# Patient Record
Sex: Female | Born: 1980 | Race: White | Hispanic: No | Marital: Single | Smoking: Current some day smoker
Health system: Southern US, Community
[De-identification: ages and names within clinical notes are randomized; demographics above are authoritative.]

## PROBLEM LIST (undated history)

## (undated) DIAGNOSIS — C4490 Unspecified malignant neoplasm of skin, unspecified: Secondary | ICD-10-CM

## (undated) DIAGNOSIS — F32A Depression, unspecified: Secondary | ICD-10-CM

## (undated) DIAGNOSIS — F329 Major depressive disorder, single episode, unspecified: Secondary | ICD-10-CM

## (undated) HISTORY — PX: OTHER SURGICAL HISTORY: SHX169

---

## 2005-03-20 ENCOUNTER — Inpatient Hospital Stay: Payer: Self-pay | Admitting: Unknown Physician Specialty

## 2012-03-16 ENCOUNTER — Emergency Department: Payer: Self-pay | Admitting: Emergency Medicine

## 2012-03-16 LAB — URINALYSIS, COMPLETE
Bilirubin,UR: NEGATIVE
Glucose,UR: NEGATIVE mg/dL (ref 0–75)
Ketone: NEGATIVE
Nitrite: NEGATIVE
Protein: 30
Specific Gravity: 1.021 (ref 1.003–1.030)
Squamous Epithelial: 73

## 2012-03-16 LAB — ETHANOL
Ethanol %: <0.003 % (ref 0.000–0.080)
Ethanol: <3 mg/dL

## 2012-03-16 LAB — CBC
HCT: 42.3 % (ref 35.0–47.0)
MCHC: 34.9 g/dL (ref 32.0–36.0)
MCV: 88 fL (ref 80–100)
Platelet: 234 10*3/uL (ref 150–440)
RBC: 4.81 10*6/uL (ref 3.80–5.20)
RDW: 12.8 % (ref 11.5–14.5)

## 2012-03-16 LAB — DRUG SCREEN, URINE
Amphetamines, Ur Screen: NEGATIVE (ref ?–1000)
Barbiturates, Ur Screen: NEGATIVE (ref ?–200)
Cannabinoid 50 Ng, Ur ~~LOC~~: NEGATIVE (ref ?–50)
Cocaine Metabolite,Ur ~~LOC~~: NEGATIVE (ref ?–300)
MDMA (Ecstasy)Ur Screen: NEGATIVE (ref ?–500)
Phencyclidine (PCP) Ur S: NEGATIVE (ref ?–25)
Tricyclic, Ur Screen: NEGATIVE (ref ?–1000)

## 2012-03-16 LAB — COMPREHENSIVE METABOLIC PANEL
Albumin: 4.5 g/dL (ref 3.4–5.0)
Anion Gap: 5 — ABNORMAL LOW (ref 7–16)
BUN: 7 mg/dL (ref 7–18)
Bilirubin,Total: 0.5 mg/dL (ref 0.2–1.0)
Chloride: 107 mmol/L (ref 98–107)
Co2: 26 mmol/L (ref 21–32)
Osmolality: 274 (ref 275–301)
SGOT(AST): 17 U/L (ref 15–37)
SGPT (ALT): 15 U/L (ref 12–78)
Sodium: 138 mmol/L (ref 136–145)

## 2012-03-16 LAB — TSH: Thyroid Stimulating Horm: 0.65 u[IU]/mL

## 2012-10-14 ENCOUNTER — Emergency Department: Payer: Self-pay | Admitting: Emergency Medicine

## 2012-10-14 LAB — COMPREHENSIVE METABOLIC PANEL
Albumin: 4 g/dL (ref 3.4–5.0)
Alkaline Phosphatase: 83 U/L (ref 50–136)
Anion Gap: 3 — ABNORMAL LOW (ref 7–16)
Bilirubin,Total: 0.3 mg/dL (ref 0.2–1.0)
Calcium, Total: 8.5 mg/dL (ref 8.5–10.1)
Chloride: 105 mmol/L (ref 98–107)
Creatinine: 0.69 mg/dL (ref 0.60–1.30)
EGFR (African American): >60
EGFR (Non-African Amer.): >60
Osmolality: 276 (ref 275–301)
SGOT(AST): 19 U/L (ref 15–37)
Sodium: 138 mmol/L (ref 136–145)
Total Protein: 7.9 g/dL (ref 6.4–8.2)

## 2012-10-14 LAB — CBC
HCT: 40.1 % (ref 35.0–47.0)
HGB: 13.4 g/dL (ref 12.0–16.0)
MCH: 29.8 pg (ref 26.0–34.0)
MCHC: 33.5 g/dL (ref 32.0–36.0)
Platelet: 209 10*3/uL (ref 150–440)
RBC: 4.51 10*6/uL (ref 3.80–5.20)
WBC: 10.6 10*3/uL (ref 3.6–11.0)

## 2012-10-14 LAB — ETHANOL: Ethanol %: <0.003 % (ref 0.000–0.080)

## 2012-10-14 LAB — TSH: Thyroid Stimulating Horm: 0.49 u[IU]/mL

## 2012-10-23 ENCOUNTER — Emergency Department: Payer: Self-pay | Admitting: Emergency Medicine

## 2012-10-23 LAB — COMPREHENSIVE METABOLIC PANEL
Albumin: 4.4 g/dL (ref 3.4–5.0)
Alkaline Phosphatase: 63 U/L (ref 50–136)
Anion Gap: 7 (ref 7–16)
BUN: 9 mg/dL (ref 7–18)
Bilirubin,Total: 0.4 mg/dL (ref 0.2–1.0)
Calcium, Total: 9 mg/dL (ref 8.5–10.1)
Chloride: 104 mmol/L (ref 98–107)
Co2: 24 mmol/L (ref 21–32)
EGFR (Non-African Amer.): >60
Potassium: 4 mmol/L (ref 3.5–5.1)
SGOT(AST): 17 U/L (ref 15–37)
SGPT (ALT): 17 U/L (ref 12–78)
Sodium: 135 mmol/L — ABNORMAL LOW (ref 136–145)

## 2012-10-23 LAB — CBC
HGB: 14.7 g/dL (ref 12.0–16.0)
MCH: 30.8 pg (ref 26.0–34.0)
MCHC: 35.1 g/dL (ref 32.0–36.0)
MCV: 88 fL (ref 80–100)
RBC: 4.78 10*6/uL (ref 3.80–5.20)
RDW: 12.4 % (ref 11.5–14.5)
WBC: 12.8 10*3/uL — ABNORMAL HIGH (ref 3.6–11.0)

## 2012-10-23 LAB — DRUG SCREEN, URINE
Amphetamines, Ur Screen: NEGATIVE (ref ?–1000)
Barbiturates, Ur Screen: NEGATIVE (ref ?–200)
MDMA (Ecstasy)Ur Screen: NEGATIVE (ref ?–500)
Opiate, Ur Screen: NEGATIVE (ref ?–300)
Phencyclidine (PCP) Ur S: NEGATIVE (ref ?–25)

## 2012-10-23 LAB — TSH: Thyroid Stimulating Horm: 0.55 u[IU]/mL

## 2012-10-23 LAB — ACETAMINOPHEN LEVEL: Acetaminophen: <2 ug/mL

## 2012-10-23 LAB — SALICYLATE LEVEL: Salicylates, Serum: 2.7 mg/dL

## 2012-11-12 ENCOUNTER — Emergency Department: Payer: Self-pay | Admitting: Emergency Medicine

## 2012-11-12 LAB — URINALYSIS, COMPLETE
Bilirubin,UR: NEGATIVE
Blood: NEGATIVE
Glucose,UR: NEGATIVE mg/dL (ref 0–75)
Nitrite: NEGATIVE
Ph: 7 (ref 4.5–8.0)
Specific Gravity: 1.02 (ref 1.003–1.030)
WBC UR: 9 /HPF (ref 0–5)

## 2012-11-12 LAB — COMPREHENSIVE METABOLIC PANEL
BUN: 8 mg/dL (ref 7–18)
Co2: 27 mmol/L (ref 21–32)
Creatinine: 0.62 mg/dL (ref 0.60–1.30)
EGFR (African American): >60
Glucose: 98 mg/dL (ref 65–99)
Osmolality: 274 (ref 275–301)
SGOT(AST): 16 U/L (ref 15–37)
SGPT (ALT): 14 U/L (ref 12–78)
Sodium: 138 mmol/L (ref 136–145)

## 2012-11-12 LAB — TSH: Thyroid Stimulating Horm: 0.94 u[IU]/mL

## 2012-11-12 LAB — DRUG SCREEN, URINE
Barbiturates, Ur Screen: NEGATIVE (ref ?–200)
Benzodiazepine, Ur Scrn: NEGATIVE (ref ?–200)
MDMA (Ecstasy)Ur Screen: NEGATIVE (ref ?–500)
Phencyclidine (PCP) Ur S: NEGATIVE (ref ?–25)
Tricyclic, Ur Screen: NEGATIVE (ref ?–1000)

## 2012-11-12 LAB — ETHANOL
Ethanol %: <0.003 % (ref 0.000–0.080)
Ethanol: <3 mg/dL

## 2012-11-12 LAB — CBC
HCT: 38.2 % (ref 35.0–47.0)
Platelet: 233 10*3/uL (ref 150–440)
RBC: 4.35 10*6/uL (ref 3.80–5.20)

## 2012-11-12 LAB — PREGNANCY, URINE: Pregnancy Test, Urine: NEGATIVE m[IU]/mL

## 2012-12-13 ENCOUNTER — Emergency Department: Payer: Self-pay | Admitting: Emergency Medicine

## 2012-12-13 LAB — TSH: Thyroid Stimulating Horm: 2.2 u[IU]/mL

## 2012-12-13 LAB — COMPREHENSIVE METABOLIC PANEL
Alkaline Phosphatase: 67 U/L (ref 50–136)
Anion Gap: 8 (ref 7–16)
Chloride: 105 mmol/L (ref 98–107)
Co2: 23 mmol/L (ref 21–32)
Creatinine: 0.79 mg/dL (ref 0.60–1.30)
EGFR (African American): >60
EGFR (Non-African Amer.): >60
Glucose: 112 mg/dL — ABNORMAL HIGH (ref 65–99)
Osmolality: 274 (ref 275–301)
Potassium: 4.1 mmol/L (ref 3.5–5.1)
SGOT(AST): 16 U/L (ref 15–37)
Sodium: 136 mmol/L (ref 136–145)

## 2012-12-13 LAB — URINALYSIS, COMPLETE
Bilirubin,UR: NEGATIVE
Blood: NEGATIVE
Glucose,UR: NEGATIVE mg/dL (ref 0–75)
Nitrite: NEGATIVE
Ph: 6 (ref 4.5–8.0)
RBC,UR: 2 /HPF (ref 0–5)
Squamous Epithelial: 2
WBC UR: 1 /HPF (ref 0–5)

## 2012-12-13 LAB — CBC
HCT: 39.7 % (ref 35.0–47.0)
MCH: 30.2 pg (ref 26.0–34.0)
Platelet: 254 10*3/uL (ref 150–440)
RDW: 12.6 % (ref 11.5–14.5)
WBC: 10 10*3/uL (ref 3.6–11.0)

## 2012-12-13 LAB — DRUG SCREEN, URINE
Amphetamines, Ur Screen: NEGATIVE (ref ?–1000)
Benzodiazepine, Ur Scrn: NEGATIVE (ref ?–200)
Cannabinoid 50 Ng, Ur ~~LOC~~: NEGATIVE (ref ?–50)
Methadone, Ur Screen: NEGATIVE (ref ?–300)
Opiate, Ur Screen: NEGATIVE (ref ?–300)
Phencyclidine (PCP) Ur S: NEGATIVE (ref ?–25)

## 2013-10-26 ENCOUNTER — Emergency Department: Payer: Self-pay | Admitting: Emergency Medicine

## 2013-10-26 LAB — COMPREHENSIVE METABOLIC PANEL
ANION GAP: 5 — AB (ref 7–16)
Albumin: 4.6 g/dL (ref 3.4–5.0)
Alkaline Phosphatase: 61 U/L
BUN: 6 mg/dL — ABNORMAL LOW (ref 7–18)
Bilirubin,Total: 0.4 mg/dL (ref 0.2–1.0)
CALCIUM: 8.9 mg/dL (ref 8.5–10.1)
CHLORIDE: 108 mmol/L — AB (ref 98–107)
Co2: 27 mmol/L (ref 21–32)
Creatinine: 0.69 mg/dL (ref 0.60–1.30)
EGFR (African American): >60
EGFR (Non-African Amer.): >60
Glucose: 113 mg/dL — ABNORMAL HIGH (ref 65–99)
Osmolality: 278 (ref 275–301)
POTASSIUM: 4.1 mmol/L (ref 3.5–5.1)
SGOT(AST): 19 U/L (ref 15–37)
SGPT (ALT): 16 U/L (ref 12–78)
Sodium: 140 mmol/L (ref 136–145)
Total Protein: 8.3 g/dL — ABNORMAL HIGH (ref 6.4–8.2)

## 2013-10-26 LAB — URINALYSIS, COMPLETE
Bilirubin,UR: NEGATIVE
Glucose,UR: NEGATIVE mg/dL (ref 0–75)
Ketone: NEGATIVE
NITRITE: NEGATIVE
PH: 6 (ref 4.5–8.0)
PROTEIN: NEGATIVE
SPECIFIC GRAVITY: 1.005 (ref 1.003–1.030)
WBC UR: 3 /HPF (ref 0–5)

## 2013-10-26 LAB — DRUG SCREEN, URINE
Amphetamines, Ur Screen: NEGATIVE (ref ?–1000)
BARBITURATES, UR SCREEN: NEGATIVE (ref ?–200)
BENZODIAZEPINE, UR SCRN: NEGATIVE (ref ?–200)
Cannabinoid 50 Ng, Ur ~~LOC~~: NEGATIVE (ref ?–50)
Cocaine Metabolite,Ur ~~LOC~~: NEGATIVE (ref ?–300)
MDMA (ECSTASY) UR SCREEN: NEGATIVE (ref ?–500)
Methadone, Ur Screen: NEGATIVE (ref ?–300)
Opiate, Ur Screen: NEGATIVE (ref ?–300)
PHENCYCLIDINE (PCP) UR S: NEGATIVE (ref ?–25)
Tricyclic, Ur Screen: NEGATIVE (ref ?–1000)

## 2013-10-26 LAB — CBC
HCT: 40.7 % (ref 35.0–47.0)
HGB: 14.3 g/dL (ref 12.0–16.0)
MCH: 30.5 pg (ref 26.0–34.0)
MCHC: 35.1 g/dL (ref 32.0–36.0)
MCV: 87 fL (ref 80–100)
Platelet: 262 10*3/uL (ref 150–440)
RBC: 4.68 10*6/uL (ref 3.80–5.20)
RDW: 12.5 % (ref 11.5–14.5)
WBC: 7.6 10*3/uL (ref 3.6–11.0)

## 2013-10-26 LAB — ETHANOL
Ethanol %: <0.003 % (ref 0.000–0.080)
Ethanol: <3 mg/dL

## 2013-10-26 LAB — SALICYLATE LEVEL: Salicylates, Serum: <1.7 mg/dL

## 2013-10-26 LAB — ACETAMINOPHEN LEVEL: Acetaminophen: <2 ug/mL

## 2014-11-24 NOTE — Consult Note (Signed)
Brief Consult Note: Diagnosis: Maj. depression with psychosis.   Patient was seen by consultant.   Recommend further assessment or treatment.   Comments: Psychiatry: Patient came into the emergency room with reported symptoms of auditory hallucinations hyper religious delusions paranoia. On interview with me today the patient would not cooperate. She told me that what brought her into the emergency room was "nothing". She denied having any acute symptoms. She was clearly evasive. Patient does have a history of suicidality and severe depression. At this point does not appear to be willing to cooperate with a more complete assessment. Possibly this is do to her psychosis. Medications will be continued. Patient will be evaluated again tomorrow to reassess suicidality psychosis and appropriateness of involuntary commitment.  Electronic Signatures: Gonzella Lex (MD)  (Signed 11-Apr-14 19:02)  Authored: Brief Consult Note   Last Updated: 11-Apr-14 19:02 by Gonzella Lex (MD)
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
## 2016-06-05 ENCOUNTER — Emergency Department
Admission: EM | Admit: 2016-06-05 | Discharge: 2016-06-05 | Disposition: A | Discharge status: Home / Self Care | Payer: Medicaid Other | Attending: Emergency Medicine | Admitting: Emergency Medicine

## 2016-06-05 DIAGNOSIS — F603 Borderline personality disorder: Secondary | ICD-10-CM

## 2016-06-05 DIAGNOSIS — F32A Depression, unspecified: Secondary | ICD-10-CM

## 2016-06-05 DIAGNOSIS — R45851 Suicidal ideations: Secondary | ICD-10-CM | POA: Diagnosis present

## 2016-06-05 DIAGNOSIS — F341 Dysthymic disorder: Secondary | ICD-10-CM

## 2016-06-05 DIAGNOSIS — F17200 Nicotine dependence, unspecified, uncomplicated: Secondary | ICD-10-CM | POA: Diagnosis not present

## 2016-06-05 DIAGNOSIS — Z5181 Encounter for therapeutic drug level monitoring: Secondary | ICD-10-CM | POA: Diagnosis not present

## 2016-06-05 DIAGNOSIS — F329 Major depressive disorder, single episode, unspecified: Secondary | ICD-10-CM | POA: Diagnosis not present

## 2016-06-05 HISTORY — DX: Major depressive disorder, single episode, unspecified: F32.9

## 2016-06-05 HISTORY — DX: Depression, unspecified: F32.A

## 2016-06-05 LAB — COMPREHENSIVE METABOLIC PANEL
ALT: 17 U/L (ref 14–54)
AST: 27 U/L (ref 15–41)
Albumin: 4.8 g/dL (ref 3.5–5.0)
Alkaline Phosphatase: 50 U/L (ref 38–126)
Anion gap: 10 (ref 5–15)
BUN: 7 mg/dL (ref 6–20)
CHLORIDE: 107 mmol/L (ref 101–111)
CO2: 21 mmol/L — AB (ref 22–32)
CREATININE: 0.77 mg/dL (ref 0.44–1.00)
Calcium: 9 mg/dL (ref 8.9–10.3)
GFR calc Af Amer: >60 mL/min (ref >60)
Glucose, Bld: 154 mg/dL — ABNORMAL HIGH (ref 65–99)
Potassium: 3.7 mmol/L (ref 3.5–5.1)
Sodium: 138 mmol/L (ref 135–145)
Total Bilirubin: 0.5 mg/dL (ref 0.3–1.2)
Total Protein: 8.2 g/dL — ABNORMAL HIGH (ref 6.5–8.1)

## 2016-06-05 LAB — CBC
HCT: 39.4 % (ref 35.0–47.0)
HEMOGLOBIN: 14.1 g/dL (ref 12.0–16.0)
MCH: 30.4 pg (ref 26.0–34.0)
MCHC: 35.8 g/dL (ref 32.0–36.0)
MCV: 84.7 fL (ref 80.0–100.0)
Platelets: 280 10*3/uL (ref 150–440)
RBC: 4.65 MIL/uL (ref 3.80–5.20)
RDW: 12.7 % (ref 11.5–14.5)
WBC: 14.5 10*3/uL — ABNORMAL HIGH (ref 3.6–11.0)

## 2016-06-05 LAB — URINE DRUG SCREEN, QUALITATIVE (ARMC ONLY)
Amphetamines, Ur Screen: NOT DETECTED
Barbiturates, Ur Screen: NOT DETECTED
Benzodiazepine, Ur Scrn: NOT DETECTED
CANNABINOID 50 NG, UR ~~LOC~~: NOT DETECTED
COCAINE METABOLITE, UR ~~LOC~~: NOT DETECTED
MDMA (ECSTASY) UR SCREEN: NOT DETECTED
Methadone Scn, Ur: NOT DETECTED
OPIATE, UR SCREEN: NOT DETECTED
PHENCYCLIDINE (PCP) UR S: NOT DETECTED
Tricyclic, Ur Screen: NOT DETECTED

## 2016-06-05 LAB — URINALYSIS COMPLETE WITH MICROSCOPIC (ARMC ONLY)
BILIRUBIN URINE: NEGATIVE
Glucose, UA: >500 mg/dL — AB
Ketones, ur: NEGATIVE mg/dL
Nitrite: NEGATIVE
PH: 5 (ref 5.0–8.0)
Protein, ur: NEGATIVE mg/dL
Specific Gravity, Urine: 1.007 (ref 1.005–1.030)

## 2016-06-05 LAB — POCT PREGNANCY, URINE: Preg Test, Ur: NEGATIVE

## 2016-06-05 LAB — ACETAMINOPHEN LEVEL: Acetaminophen (Tylenol), Serum: <10 ug/mL — ABNORMAL LOW (ref 10–30)

## 2016-06-05 LAB — SALICYLATE LEVEL: Salicylate Lvl: <7 mg/dL (ref 2.8–30.0)

## 2016-06-05 LAB — ETHANOL

## 2016-06-05 NOTE — Discharge Instructions (Signed)
Please seek medical attention and help for any thoughts about wanting to harm herself, harm others, any concerning change in behavior, severe depression, inappropriate drug use or any other new or concerning symptoms.

## 2016-06-05 NOTE — ED Provider Notes (Signed)
[facility] Emergency Department Provider Note  ____________________________________________  Time seen: Approximately 1:57 PM  I have reviewed the triage vital signs and the nursing notes.   HISTORY  Chief Complaint Suicidal    HPI Maria Dickerson is a 35 y.o. female who reports suicidal ideation without any specific intent or plan. No homicidal ideation or hallucinations. She reports that she is sleeping okay because she takes sleeping medicine. Normal appetite. Normal energy level. Follows up with Dr. Kasandra Knudsen in [place].Compliant with her medications.     Past Medical History:  Diagnosis Date  . Depression      There are no active problems to display for this patient.    History reviewed. No pertinent surgical history.   Prior to Admission medications   Not on File   citalopram (CELEXA) 20 MG tablet  Take 20 mg by mouth daily.     Active  OLANZapine (ZYPREXA) 10 MG tablet  Take 10 mg by mouth nightly.           Allergies Review of patient's allergies indicates no known allergies.   No family history on file.  Social History Social History  Substance Use Topics  . Smoking status: Current Every Day Smoker  . Smokeless tobacco: Not on file  . Alcohol use No    Review of Systems  Constitutional:   No fever or chills.  ENT:   No sore throat. No rhinorrhea. Cardiovascular:   No chest pain. Respiratory:   No dyspnea or cough. Gastrointestinal:   Negative for abdominal pain, vomiting and diarrhea.   10-point ROS otherwise negative.  ____________________________________________   PHYSICAL EXAM:  VITAL SIGNS: ED Triage Vitals  Enc Vitals Group     BP 06/05/16 1233 (!) 145/85     Pulse Rate 06/05/16 1233 (!) 128     Resp 06/05/16 1233 18     Temp 06/05/16 1233 99.2 F (37.3 C)     Temp Source 06/05/16 1233 Oral     SpO2 06/05/16 1233 97 %     Weight 06/05/16 1236 150 lb (68 kg)     Height 06/05/16 1236 5\' 3"  (1.6  m)     Head Circumference --      Peak Flow --      Pain Score --      Pain Loc --      Pain Edu? --      Excl. in [place]? --     Vital signs reviewed, nursing assessments reviewed.   Constitutional:   Alert and oriented. Well appearing and in no distress. Eyes:   No scleral icterus. No conjunctival pallor. PERRL. EOMI.  No nystagmus. ENT   Head:   Normocephalic and atraumatic.   Nose:   No congestion/rhinnorhea. No septal hematoma   Mouth/Throat:   MMM, no pharyngeal erythema. No peritonsillar mass.    Neck:   No stridor. No SubQ emphysema. No meningismus. Hematological/Lymphatic/Immunilogical:   No cervical lymphadenopathy. Cardiovascular:   RRR. Symmetric bilateral radial and DP pulses.  No murmurs.  Respiratory:   Normal respiratory effort without tachypnea nor retractions. Breath sounds are clear and equal bilaterally. No wheezes/rales/rhonchi. Gastrointestinal:   Soft and nontender. Non distended. There is no CVA tenderness.  No rebound, rigidity, or guarding. Genitourinary:   deferred Musculoskeletal:   Nontender with normal range of motion in all extremities. No joint effusions.  No lower extremity tenderness.  No edema. Neurologic:   Normal speech and language.  CN 2-10 normal. Motor grossly intact. No  gross focal neurologic deficits are appreciated.  Skin:    Skin is warm, dry and intact. No rash noted.  No petechiae, purpura, or bullae.  ____________________________________________    LABS (pertinent positives/negatives) (all labs ordered are listed, but only abnormal results are displayed) Labs Reviewed  COMPREHENSIVE METABOLIC PANEL - Abnormal; Notable for the following:       Result Value   CO2 21 (*)    Glucose, Bld 154 (*)    Total Protein 8.2 (*)    All other components within normal limits  ACETAMINOPHEN LEVEL - Abnormal; Notable for the following:    Acetaminophen (Tylenol), Serum <10 (*)    All other components within normal limits  CBC -  Abnormal; Notable for the following:    WBC 14.5 (*)    All other components within normal limits  ETHANOL  SALICYLATE LEVEL  URINE DRUG SCREEN, QUALITATIVE ([facility] ONLY)  POC URINE PREG, ED  POCT PREGNANCY, URINE   ____________________________________________   EKG    ____________________________________________    RADIOLOGY    ____________________________________________   PROCEDURES Procedures  ____________________________________________   INITIAL IMPRESSION / ASSESSMENT AND PLAN / ED COURSE  Pertinent labs & imaging results that were available during my care of the patient were reviewed by me and considered in my medical decision making (see chart for details).  Patient well appearing no acute distress. Presents with suicidal ideation, depressive symptoms although overall very mild at this time. I don't think the patient requires commitment. I'll request a behavioral medicine evaluation for medication management for the patient. I suspect that she will not require hospitalization and will be able to follow-up with her outpatient psychiatrist Dr. Kasandra Knudsen.   Clinical Course   ____________________________________________   FINAL CLINICAL IMPRESSION(S) / ED DIAGNOSES  Final diagnoses:  Suicidal ideation       Portions of this note were generated with dragon dictation software. Dictation errors may occur despite best attempts at proofreading.    Carrie Mew, MD 06/05/16 [phone]

## 2016-06-05 NOTE — ED Provider Notes (Signed)
Dr. Weber Cooks with psychiatry has evaluated the patient. At this point think she is safe for discharge. Has appointment at [facility] tomorrow and does follow up with outpatient psychiatrist.    Nance Pear, MD 06/05/16 [phone]

## 2016-06-05 NOTE — Consult Note (Signed)
[facility] Face-to-Face Psychiatry Consult   Reason for Consult:  Consult for a 35 year old woman with a history of mood disorder who came voluntarily to the emergency room today requesting evaluation. Referring Physician:  Joni Fears Patient Identification: Maria Dickerson MRN:  [id number] Principal Diagnosis: Borderline personality disorder Diagnosis:   Patient Active Problem List   Diagnosis Date Noted  . Dysthymia [F34.1] 06/05/2016  . Borderline personality disorder [F60.3] 06/05/2016    Total Time spent with patient: 1 hour  Subjective:   Maria Dickerson is a 35 y.o. female patient admitted with "I've just been feeling so bad".  HPI:  Patient interviewed. Chart reviewed including labs and old notes. Case reviewed with TTS and emergency room physician. 35 year old woman came voluntarily to the emergency room asking to talk to someone about her mood symptoms. Patient tells me that she's been feeling more down and especially more irritable for about 2 weeks now. She's been snapping and losing her temper with her parents. Getting into more verbal arguments with him. Feeling internally bad about herself and guilty. Sleeping a little bit worse than usual which is chronically bad. Doesn't have any other physical symptoms. She has been compliant with her usual psychiatric medicine. She is not drinking alcohol or abusing any drugs. She has a stress she can identify that her sister has come in from out of town and been staying with the family for the last couple weeks. In general relations with her family are a major source of all of her stress. Patient has very little positive in her life and few things that she can identify that make her feel any better. She is not working and doesn't do much outside the house.  Social history: Patient is not working outside the home. 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 She tends to have  jobs for short period of time and then lose them related to her mood or behavior. Same situation applies to her marriages of which there've been 3 none of which produced any children. Patient lives with her parents. Clearly has a love-hate relationship with all of her family. Also talks about how she has no friends but at the same time gets overwhelmed by anxiety when she tries to leave the house and think about doing something social.  Medical history: No significant medical problems.  Substance abuse history: Denies alcohol or drug use and does not have a known past substance abuse history.    Past Psychiatric History: Patient has had mental health problems for years. She's been to our emergency room at least a couple times in the past and all of the visits have ultimately been like this one. She comes in with some symptoms and then calms down fairly quickly and doesn't need hospitalization. She says she is actually never been in a psychiatric hospital and never seriously tried to kill her self. She sees Dr. Kasandra Knudsen in [facility] for her treatment and has been going to him for years.  Risk to Self: Is patient at risk for suicide?: Yes Risk to Others:   Prior Inpatient Therapy:   Prior Outpatient Therapy:    Past Medical History:  Past Medical History:  Diagnosis Date  . Depression    History reviewed. No pertinent surgical history. Family History: No family history on file. Family Psychiatric  History: She says her father has bipolar disorder as well. Social History:  History  Alcohol Use  No     History  Drug use: Unknown    Social History   Social History  . Marital status: Single    Spouse name: N/A  . Number of children: N/A  . Years of education: N/A   Social History Main Topics  . Smoking status: Current Every Day Smoker  . Smokeless tobacco: None  . Alcohol use No  . Drug use: Unknown  . Sexual activity: Not Asked   Other Topics Concern  . None   Social History  Narrative  . None   Additional Social History:    Allergies:  No Known Allergies  Labs:  Results for orders placed or performed during the hospital encounter of 06/05/16 (from the past 48 hour(s))  Comprehensive metabolic panel     Status: Abnormal   Collection Time: 06/05/16 12:38 PM  Result Value Ref Range   Sodium 138 135 - 145 mmol/L   Potassium 3.7 3.5 - 5.1 mmol/L   Chloride 107 101 - 111 mmol/L   CO2 21 (L) 22 - 32 mmol/L   Glucose, Bld 154 (H) 65 - 99 mg/dL   BUN 7 6 - 20 mg/dL   Creatinine, Ser 0.77 0.44 - 1.00 mg/dL   Calcium 9.0 8.9 - 10.3 mg/dL   Total Protein 8.2 (H) 6.5 - 8.1 g/dL   Albumin 4.8 3.5 - 5.0 g/dL   AST 27 15 - 41 U/L   ALT 17 14 - 54 U/L   Alkaline Phosphatase 50 38 - 126 U/L   Total Bilirubin 0.5 0.3 - 1.2 mg/dL   GFR calc non Af Amer >60 >60 mL/min   GFR calc Af Amer >60 >60 mL/min    Comment: (NOTE) The eGFR has been calculated using the CKD EPI equation. This calculation has not been validated in all clinical situations. eGFR's persistently <60 mL/min signify possible Chronic Kidney Disease.    Anion gap 10 5 - 15  Ethanol     Status: None   Collection Time: 06/05/16 12:38 PM  Result Value Ref Range   Alcohol, Ethyl (B) <5 <5 mg/dL    Comment:        LOWEST DETECTABLE LIMIT FOR SERUM ALCOHOL IS 5 mg/dL FOR MEDICAL PURPOSES ONLY   Salicylate level     Status: None   Collection Time: 06/05/16 12:38 PM  Result Value Ref Range   Salicylate Lvl <9.7 2.8 - 30.0 mg/dL  Acetaminophen level     Status: Abnormal   Collection Time: 06/05/16 12:38 PM  Result Value Ref Range   Acetaminophen (Tylenol), Serum <10 (L) 10 - 30 ug/mL    Comment:        THERAPEUTIC CONCENTRATIONS VARY SIGNIFICANTLY. A RANGE OF 10-30 ug/mL MAY BE AN EFFECTIVE CONCENTRATION FOR MANY PATIENTS. HOWEVER, SOME ARE BEST TREATED AT CONCENTRATIONS OUTSIDE THIS RANGE. ACETAMINOPHEN CONCENTRATIONS >150 ug/mL AT 4 HOURS AFTER INGESTION AND >50 ug/mL AT 12 HOURS AFTER  INGESTION ARE OFTEN ASSOCIATED WITH TOXIC REACTIONS.   cbc     Status: Abnormal   Collection Time: 06/05/16 12:38 PM  Result Value Ref Range   WBC 14.5 (H) 3.6 - 11.0 K/uL   RBC 4.65 3.80 - 5.20 MIL/uL   Hemoglobin 14.1 12.0 - 16.0 g/dL   HCT 39.4 35.0 - 47.0 %   MCV 84.7 80.0 - 100.0 fL   MCH 30.4 26.0 - 34.0 pg   MCHC 35.8 32.0 - 36.0 g/dL   RDW 12.7 11.5 - 14.5 %   Platelets 280 150 -  440 K/uL  Urine Drug Screen, Qualitative     Status: None   Collection Time: 06/05/16 12:38 PM  Result Value Ref Range   Tricyclic, Ur Screen NONE DETECTED NONE DETECTED   Amphetamines, Ur Screen NONE DETECTED NONE DETECTED   MDMA (Ecstasy)Ur Screen NONE DETECTED NONE DETECTED   Cocaine Metabolite,Ur [place] NONE DETECTED NONE DETECTED   Opiate, Ur Screen NONE DETECTED NONE DETECTED   Phencyclidine (PCP) Ur S NONE DETECTED NONE DETECTED   Cannabinoid 50 Ng, Ur [place] NONE DETECTED NONE DETECTED   Barbiturates, Ur Screen NONE DETECTED NONE DETECTED   Benzodiazepine, Ur Scrn NONE DETECTED NONE DETECTED   Methadone Scn, Ur NONE DETECTED NONE DETECTED    Comment: (NOTE) 710  Tricyclics, urine               Cutoff 1000 ng/mL 200  Amphetamines, urine             Cutoff 1000 ng/mL 300  MDMA (Ecstasy), urine           Cutoff 500 ng/mL 400  Cocaine Metabolite, urine       Cutoff 300 ng/mL 500  Opiate, urine                   Cutoff 300 ng/mL 600  Phencyclidine (PCP), urine      Cutoff 25 ng/mL 700  Cannabinoid, urine              Cutoff 50 ng/mL 800  Barbiturates, urine             Cutoff 200 ng/mL 900  Benzodiazepine, urine           Cutoff 200 ng/mL 1000 Methadone, urine                Cutoff 300 ng/mL 1100 1200 The urine drug screen provides only a preliminary, unconfirmed 1300 analytical test result and should not be used for non-medical 1400 purposes. Clinical consideration and professional judgment should 1500 be applied to any positive drug screen result due to possible 1600 interfering substances.  A more specific alternate chemical method 1700 must be used in order to obtain a confirmed analytical result.  1800 Gas chromato graphy / mass spectrometry (GC/MS) is the preferred 1900 confirmatory method.   Pregnancy, urine POC     Status: None   Collection Time: 06/05/16 12:45 PM  Result Value Ref Range   Preg Test, Ur NEGATIVE NEGATIVE    Comment:        THE SENSITIVITY OF THIS METHODOLOGY IS >24 mIU/mL     No current facility-administered medications for this encounter.    No current outpatient prescriptions on file.    Musculoskeletal: Strength & Muscle Tone: within normal limits Gait & Station: normal Patient leans: N/A  Psychiatric Specialty Exam: Physical Exam  Nursing note and vitals reviewed. Constitutional: She appears well-developed and well-nourished.  HENT:  Head: Normocephalic and atraumatic.  Eyes: Conjunctivae are normal. Pupils are equal, round, and reactive to light.  Neck: Normal range of motion.  Cardiovascular: Regular rhythm and normal heart sounds.   Respiratory: Effort normal. No respiratory distress.  GI: Soft.  Musculoskeletal: Normal range of motion.  Neurological: She is alert.  Skin: Skin is warm and dry.  Psychiatric: Her speech is normal. Her affect is blunt. She is slowed. Thought content is not paranoid. Cognition and memory are normal. She expresses impulsivity. She expresses no homicidal and no suicidal ideation.    Review of Systems  Constitutional: Negative.  HENT: Negative.   Eyes: Negative.   Respiratory: Negative.   Cardiovascular: Negative.   Gastrointestinal: Negative.   Musculoskeletal: Negative.   Skin: Negative.   Neurological: Negative.   Psychiatric/Behavioral: Positive for depression. Negative for hallucinations, memory loss, substance abuse and suicidal ideas. The patient is nervous/anxious and has insomnia.     Blood pressure (!) 145/85, pulse (!) 128, temperature 99.2 F (37.3 C), temperature source Oral,  resp. rate 18, height 5' 3"  (1.6 m), weight 68 kg (150 lb), last menstrual period 05/29/2016, SpO2 97 %.Body mass index is 26.57 kg/m.  General Appearance: Disheveled  Eye Contact:  Good  Speech:  Slow  Volume:  Decreased  Mood:  Dysphoric  Affect:  Constricted  Thought Process:  Goal Directed  Orientation:  Full (Time, Place, and Person)  Thought Content:  Logical and Rumination  Suicidal Thoughts:  No  Homicidal Thoughts:  No  Memory:  Immediate;   Good Recent;   Fair Remote;   Fair  Judgement:  Fair  Insight:  Fair  Psychomotor Activity:  Decreased  Concentration:  Concentration: Fair  Recall:  [employer] of Knowledge:  Fair  Language:  Fair  Akathisia:  No  Handed:  Right  AIMS (if indicated):     Assets:  Communication Skills Desire for Improvement Housing Physical Health Resilience Social Support  ADL's:  Intact  Cognition:  WNL  Sleep:        Treatment Plan Summary: Plan 35 year old woman with chronic mood and anxiety symptoms. After talking with her a while it sounds like a lot of her problems can be tied together as fitting a borderline sort of syndrome with a lot of difficulty in self image and relationship with others and a lot of impulsivity and mood swings. She is not acutely suicidal or psychotic and does not need inpatient hospital level treatment. Spent some time with her doing some cognitive and supportive therapy and trying to get her to think through some ways that she could possibly make some positive changes for herself. She tells me that she is planning to switch psychiatrists at her mother's encouragement and will be going to [facility] tomorrow. No indication therefore to even think about changing medicines for now. Case reviewed with emergency room doctor. She can be discharged from the emergency room and will follow-up with her local providers. Patient appears satisfied with the plan as well.  Disposition: Patient does not meet criteria for psychiatric  inpatient admission. Supportive therapy provided about ongoing stressors.  Alethia Berthold, MD 06/05/2016 4:05 PM

## 2016-06-05 NOTE — ED Notes (Signed)
Report given to Abigail Butts RN in [place]  Pt to transfer when psych consult completed  NAD assessed

## 2016-06-05 NOTE — ED Notes (Signed)
Meal provided 

## 2016-06-05 NOTE — ED Notes (Signed)
BEHAVIORAL HEALTH ROUNDING Patient sleeping: No. Patient alert and oriented: yes Behavior appropriate: Yes.  ; If no, describe:  Nutrition and fluids offered: yes Toileting and hygiene offered: Yes  Sitter present: q15 minute observations and security  monitoring Law enforcement present: Yes  ODS   Psychiatrist is consulting at this time

## 2016-06-05 NOTE — ED Notes (Signed)

## 2016-06-05 NOTE — ED Notes (Addendum)
BEHAVIORAL HEALTH ROUNDING Patient sleeping: No. Patient alert and oriented: yes Behavior appropriate: Yes.  ; If no, describe:  Nutrition and fluids offered: yes Toileting and hygiene offered: Yes  Sitter present: q15 minute observations and security  monitoring Law enforcement present: Yes  ODS  

## 2016-06-05 NOTE — ED Triage Notes (Signed)
Pt arrives to ER via POV c/o feeling depressed. Pt states that she is always depressed but worsening today. Thoughts of taking pills, but has not tried today. Pt pleasant.

## 2017-03-14 DIAGNOSIS — F17200 Nicotine dependence, unspecified, uncomplicated: Secondary | ICD-10-CM | POA: Insufficient documentation

## 2017-03-14 DIAGNOSIS — R0781 Pleurodynia: Secondary | ICD-10-CM | POA: Insufficient documentation

## 2017-03-14 DIAGNOSIS — Z79899 Other long term (current) drug therapy: Secondary | ICD-10-CM | POA: Insufficient documentation

## 2017-03-15 ENCOUNTER — Emergency Department: Payer: Medicaid Other

## 2017-03-15 ENCOUNTER — Emergency Department
Admission: EM | Admit: 2017-03-15 | Discharge: 2017-03-15 | Disposition: A | Discharge status: Home / Self Care | Payer: Medicaid Other | Attending: Emergency Medicine | Admitting: Emergency Medicine

## 2017-03-15 ENCOUNTER — Other Ambulatory Visit: Payer: Self-pay

## 2017-03-15 DIAGNOSIS — R0781 Pleurodynia: Secondary | ICD-10-CM

## 2017-03-15 LAB — BASIC METABOLIC PANEL
ANION GAP: 11 (ref 5–15)
BUN: 10 mg/dL (ref 6–20)
CO2: 22 mmol/L (ref 22–32)
Calcium: 9.4 mg/dL (ref 8.9–10.3)
Chloride: 105 mmol/L (ref 101–111)
Creatinine, Ser: 0.67 mg/dL (ref 0.44–1.00)
GFR calc non Af Amer: >60 mL/min (ref >60)
GLUCOSE: 135 mg/dL — AB (ref 65–99)
POTASSIUM: 3.8 mmol/L (ref 3.5–5.1)
Sodium: 138 mmol/L (ref 135–145)

## 2017-03-15 LAB — CBC
HEMATOCRIT: 38.4 % (ref 35.0–47.0)
HEMOGLOBIN: 13.3 g/dL (ref 12.0–16.0)
MCH: 29.5 pg (ref 26.0–34.0)
MCHC: 34.6 g/dL (ref 32.0–36.0)
MCV: 85.3 fL (ref 80.0–100.0)
Platelets: 273 10*3/uL (ref 150–440)
RBC: 4.51 MIL/uL (ref 3.80–5.20)
RDW: 13 % (ref 11.5–14.5)
WBC: 10.2 10*3/uL (ref 3.6–11.0)

## 2017-03-15 LAB — TROPONIN I: Troponin I: <0.03 ng/mL (ref <0.03)

## 2017-03-15 LAB — FIBRIN DERIVATIVES D-DIMER (ARMC ONLY): Fibrin derivatives D-dimer (ARMC): 195.34 (ref 0.00–499.00)

## 2017-03-15 LAB — SEDIMENTATION RATE: Sed Rate: 22 mm/hr — ABNORMAL HIGH (ref 0–20)

## 2017-03-15 MED ORDER — IBUPROFEN 600 MG PO TABS: ORAL_TABLET | ORAL | 0 refills | Status: AC

## 2017-03-15 MED ORDER — ASPIRIN 81 MG PO CHEW
324.0000 mg | CHEWABLE_TABLET | Freq: Once | ORAL | Status: AC
  Administered 2017-03-15: 324 mg via ORAL

## 2017-03-15 MED ORDER — HYDROCODONE-ACETAMINOPHEN 5-325 MG PO TABS
2.0000 | ORAL_TABLET | Freq: Once | ORAL | Status: AC
  Administered 2017-03-15: 2 via ORAL

## 2017-03-15 MED ORDER — COLCHICINE 0.6 MG PO TABS: 0.6000 mg | ORAL_TABLET | Freq: Every day | ORAL | 0 refills | Status: DC

## 2017-03-15 MED ORDER — HYDROCODONE-ACETAMINOPHEN 5-325 MG PO TABS
ORAL_TABLET | ORAL | Status: AC
  Filled 2017-03-15: qty 2

## 2017-03-15 MED ORDER — ASPIRIN 81 MG PO CHEW
CHEWABLE_TABLET | ORAL | Status: AC
  Filled 2017-03-15: qty 4

## 2017-03-15 NOTE — Discharge Instructions (Signed)
You have been seen in the Emergency Department (ED) today for chest pain.  As we have discussed today?s test results are normal, but you may require further testing.  We included some general information about chest pain, as well as some information about pericarditis, a condition you that you MAY have which would explain your symptoms.  Please follow up with the recommended doctor as instructed above in these documents regarding today?s emergent visit and your recent symptoms to discuss further management.  Continue to take any regular medications, and please take the medications prescribed until you follow up with the cardiologist.  Return to the Emergency Department (ED) if you experience any further chest pain/pressure/tightness, difficulty breathing, or sudden sweating, or other symptoms that concern you.

## 2017-03-15 NOTE — ED Notes (Signed)
Pt states onset of left sided chest pain while at rest yesterday morning. Pt states she felt shob when pain began. Pt denies other symptoms. Pt states pain is better than when she checked in to be seen. Pt updated on next troponin draw time. Warm blankets provided, call bell at left side.

## 2017-03-15 NOTE — ED Provider Notes (Signed)
[facility] Emergency Department Provider Note  ____________________________________________   First MD Initiated Contact with Patient 03/15/17 [phone]     (approximate)  I have reviewed the triage vital signs and the nursing notes.   HISTORY  Chief Complaint Chest Pain  The patient is from [place] so English is not her first language but she and her husband both speak English with excellent proficiency and are communicating with me without difficulty.  HPI Maria Dickerson is a 36 y.o. female with psychiatric history as listed below but no chronic medical history other than every day tobacco use.  She presents for evaluation of acute onset left-sided chest pain that started about 24 hours ago in the morning.  She had no injury or unusual amount of strain or stress to her body.  The pain simply started on its own and has continued throughout the day.  It is moderate to severe and located just left of her sternum and her upper chest.  It is worse with deep breaths and it is worse with lying supine, better when standing or leaning forward.  She does not feel short of breath, but deep breaths worsen the pain.  She has had no recent infectious symptoms including no fever/chills, congestion, runny nose, cough, difficulty breathing/wheezing, abdominal pain, nausea/vomiting/diarrhea, nor dysuria.    She has not been on any long trips recently nor had any immobilizations and she uses no exogenous estrogen.  She has never had any blood clots in her legs nor lungs.  Past Medical History:  Diagnosis Date  . Depression     Patient Active Problem List   Diagnosis Date Noted  . Dysthymia 06/05/2016  . Borderline personality disorder 06/05/2016    No past surgical history on file.  Prior to Admission medications   Medication Sig Start Date End Date Taking? Authorizing Provider  colchicine 0.6 MG tablet Take 1 tablet (0.6 mg total) by mouth daily. 03/15/17   Hinda Kehr, MD    ibuprofen (ADVIL,MOTRIN) 600 MG tablet Take 1 tablet by mouth three times daily with meals 03/15/17 03/29/17  Hinda Kehr, MD    Allergies Patient has no known allergies.  No family history on file.  Social History Social History  Substance Use Topics  . Smoking status: Current Every Day Smoker  . Smokeless tobacco: Not on file  . Alcohol use No    Review of Systems Constitutional: No fever/chills Eyes: No visual changes. ENT: No sore throat. Cardiovascular: +chest pain. Respiratory: Denies shortness of breath. Worsening chest pain with deep breaths Gastrointestinal: No abdominal pain.  No nausea, no vomiting.  No diarrhea.  No constipation. Genitourinary: Negative for dysuria. Musculoskeletal: Negative for neck pain.  Negative for back pain. Integumentary: Negative for rash. Neurological: Negative for headaches, focal weakness or numbness.   ____________________________________________   PHYSICAL EXAM:  VITAL SIGNS: ED Triage Vitals  Enc Vitals Group     BP 03/15/17 0007 113/89     Pulse Rate 03/15/17 0007 83     Resp 03/15/17 0007 20     Temp 03/15/17 0007 98.5 F (36.9 C)     Temp Source 03/15/17 0007 Oral     SpO2 03/15/17 0007 97 %     Weight 03/15/17 0006 59 kg (130 lb)     Height 03/15/17 0006 1.575 m (5\' 2" )     Head Circumference --      Peak Flow --      Pain Score 03/15/17 0228 3  Pain Loc --      Pain Edu? --      Excl. in [place]? --     Constitutional: Alert and oriented. Well appearing and in no acute distress. Eyes: Conjunctivae are normal.  Head: Atraumatic. Nose: No congestion/rhinnorhea. Mouth/Throat: Mucous membranes are moist. Neck: No stridor.  No meningeal signs.   Cardiovascular: Normal rate, regular rhythm. Good peripheral circulation. Grossly normal heart sounds Including no rub. No reproducible chest wall tenderness Respiratory: Normal respiratory effort.  No retractions. Lungs CTAB. Gastrointestinal: Soft and nontender. No  distention.  Musculoskeletal: No lower extremity tenderness nor edema. No gross deformities of extremities. Neurologic:  Normal speech and language. No gross focal neurologic deficits are appreciated.  Skin:  Skin is warm, dry and intact. No rash noted. Psychiatric: Mood and affect are normal. Speech and behavior are normal.  ____________________________________________   LABS (all labs ordered are listed, but only abnormal results are displayed)  Labs Reviewed  BASIC METABOLIC PANEL - Abnormal; Notable for the following:       Result Value   Glucose, Bld 135 (*)    All other components within normal limits  SEDIMENTATION RATE - Abnormal; Notable for the following:    Sed Rate 22 (*)    All other components within normal limits  CBC  TROPONIN I  TROPONIN I  FIBRIN DERIVATIVES D-DIMER ([facility] ONLY)   ____________________________________________  EKG  ED ECG REPORT I, Ole Lafon, the attending physician, personally viewed and interpreted this ECG.  Date: 03/15/2017 EKG Time: 00:02 Rate: 86 Rhythm: normal sinus rhythm QRS Axis: normal Intervals: normal ST/T Wave abnormalities: normal Narrative Interpretation: unremarkable  ____________________________________________  RADIOLOGY   Dg Chest 2 View  Result Date: 03/15/2017 CLINICAL DATA:  Acute onset of left-sided chest pain. Initial encounter. EXAM: CHEST  2 VIEW COMPARISON:  None. FINDINGS: The lungs are well-aerated and clear. There is no evidence of focal opacification, pleural effusion or pneumothorax. The heart is normal in size; the mediastinal contour is within normal limits. No acute osseous abnormalities are seen. IMPRESSION: No acute cardiopulmonary process seen. Electronically Signed   By: Garald Balding M.D.   On: 03/15/2017 00:29    ____________________________________________   PROCEDURES  Critical Care performed: No   Procedure(s) performed:    Procedures   ____________________________________________   INITIAL IMPRESSION / ASSESSMENT AND PLAN / ED COURSE  Pertinent labs & imaging results that were available during my care of the patient were reviewed by me and considered in my medical decision making (see chart for details).  The patient has pleuritic chest pain, the nature and description of which makes me consider pericarditis, but she has a normal EKG and normal lab work.  After seeing her initially added on a sedimentation rate and a d-dimer even that she has a low risk for pulmonary embolism but I felt that further risk stratification was relevant.  She has a very slightly elevated sedimentation rate at 22, just barely above the upper limit, and a d-dimer within normal limits.  She has had 2 negative troponins and again has no significant EKG abnormalities.  She has no murmurs rubs or gallops and no reproducible chest wall pain.  She also has no primary care provider with whom to follow up.  Gave her a full dose aspirin in the emergency department with Norco x 2.  She feels better now and has been sleeping lightly.  I will recommend ibuprofen 600 mg PO TID with meals and will hold off on colchicine because  the patient states she will follow up next week with cardiology.  I will give follow up information and stressed the importance of follow up.  She understands and agrees.    I gave my usual and customary return precautions.        ____________________________________________  FINAL CLINICAL IMPRESSION(S) / ED DIAGNOSES  Final diagnoses:  Pleuritic chest pain     MEDICATIONS GIVEN DURING THIS VISIT:  Medications  aspirin chewable tablet 324 mg (324 mg Oral Given 03/15/17 0336)  HYDROcodone-acetaminophen (NORCO/VICODIN) 5-325 MG per tablet 2 tablet (2 tablets Oral Given 03/15/17 0336)     NEW OUTPATIENT MEDICATIONS STARTED DURING THIS VISIT:  New Prescriptions   COLCHICINE 0.6 MG TABLET    Take 1 tablet (0.6  mg total) by mouth daily.   IBUPROFEN (ADVIL,MOTRIN) 600 MG TABLET    Take 1 tablet by mouth three times daily with meals    Modified Medications   No medications on file    Discontinued Medications   No medications on file     Note:  This document was prepared using Dragon voice recognition software and may include unintentional dictation errors.    Hinda Kehr, MD 03/15/17 [phone]

## 2017-03-15 NOTE — ED Triage Notes (Signed)
Patient reports left chest pain (points to breast area) non radiating that comes and goes all day.  Patient reports laying makes the pain worse.

## 2017-03-15 NOTE — ED Notes (Signed)
Reviewed d/c instructions, follow-up care, prescriptions with patient. Pt verbalized understanding.  

## 2017-04-18 ENCOUNTER — Encounter: Payer: Self-pay | Admitting: Emergency Medicine

## 2017-04-18 ENCOUNTER — Emergency Department
Admission: EM | Admit: 2017-04-18 | Discharge: 2017-04-18 | Disposition: A | Discharge status: Home / Self Care | Payer: Self-pay | Attending: Emergency Medicine | Admitting: Emergency Medicine

## 2017-04-18 DIAGNOSIS — F32A Depression, unspecified: Secondary | ICD-10-CM

## 2017-04-18 DIAGNOSIS — F17210 Nicotine dependence, cigarettes, uncomplicated: Secondary | ICD-10-CM | POA: Insufficient documentation

## 2017-04-18 DIAGNOSIS — R45851 Suicidal ideations: Secondary | ICD-10-CM | POA: Insufficient documentation

## 2017-04-18 DIAGNOSIS — Z85828 Personal history of other malignant neoplasm of skin: Secondary | ICD-10-CM | POA: Insufficient documentation

## 2017-04-18 DIAGNOSIS — Z79899 Other long term (current) drug therapy: Secondary | ICD-10-CM | POA: Insufficient documentation

## 2017-04-18 DIAGNOSIS — F329 Major depressive disorder, single episode, unspecified: Secondary | ICD-10-CM | POA: Insufficient documentation

## 2017-04-18 HISTORY — DX: Unspecified malignant neoplasm of skin, unspecified: C44.90

## 2017-04-18 LAB — COMPREHENSIVE METABOLIC PANEL
ALBUMIN: 4.6 g/dL (ref 3.5–5.0)
ALT: 21 U/L (ref 14–54)
ANION GAP: 10 (ref 5–15)
AST: 25 U/L (ref 15–41)
Alkaline Phosphatase: 48 U/L (ref 38–126)
BILIRUBIN TOTAL: 0.9 mg/dL (ref 0.3–1.2)
BUN: 11 mg/dL (ref 6–20)
CHLORIDE: 108 mmol/L (ref 101–111)
CO2: 20 mmol/L — AB (ref 22–32)
CREATININE: 0.67 mg/dL (ref 0.44–1.00)
Calcium: 9.3 mg/dL (ref 8.9–10.3)
GFR calc Af Amer: >60 mL/min (ref >60)
Glucose, Bld: 139 mg/dL — ABNORMAL HIGH (ref 65–99)
Potassium: 3.5 mmol/L (ref 3.5–5.1)
Sodium: 138 mmol/L (ref 135–145)
Total Protein: 7.8 g/dL (ref 6.5–8.1)

## 2017-04-18 LAB — CBC
HCT: 39.7 % (ref 35.0–47.0)
HEMOGLOBIN: 14.3 g/dL (ref 12.0–16.0)
MCH: 30.6 pg (ref 26.0–34.0)
MCHC: 35.9 g/dL (ref 32.0–36.0)
MCV: 85 fL (ref 80.0–100.0)
Platelets: 249 10*3/uL (ref 150–440)
RBC: 4.66 MIL/uL (ref 3.80–5.20)
RDW: 13 % (ref 11.5–14.5)
WBC: 7.9 10*3/uL (ref 3.6–11.0)

## 2017-04-18 LAB — ACETAMINOPHEN LEVEL: Acetaminophen (Tylenol), Serum: <10 ug/mL — ABNORMAL LOW (ref 10–30)

## 2017-04-18 LAB — URINE DRUG SCREEN, QUALITATIVE (ARMC ONLY)
Amphetamines, Ur Screen: NOT DETECTED
Barbiturates, Ur Screen: NOT DETECTED
Benzodiazepine, Ur Scrn: NOT DETECTED
CANNABINOID 50 NG, UR ~~LOC~~: NOT DETECTED
COCAINE METABOLITE, UR ~~LOC~~: NOT DETECTED
MDMA (ECSTASY) UR SCREEN: NOT DETECTED
METHADONE SCREEN, URINE: NOT DETECTED
Opiate, Ur Screen: NOT DETECTED
Phencyclidine (PCP) Ur S: NOT DETECTED
TRICYCLIC, UR SCREEN: NOT DETECTED

## 2017-04-18 LAB — SALICYLATE LEVEL

## 2017-04-18 LAB — URINALYSIS, COMPLETE (UACMP) WITH MICROSCOPIC
BILIRUBIN URINE: NEGATIVE
Glucose, UA: NEGATIVE mg/dL
KETONES UR: 20 mg/dL — AB
NITRITE: NEGATIVE
PROTEIN: 30 mg/dL — AB
Specific Gravity, Urine: 1.013 (ref 1.005–1.030)
pH: 6 (ref 5.0–8.0)

## 2017-04-18 LAB — ETHANOL: Alcohol, Ethyl (B): <5 mg/dL (ref <5)

## 2017-04-18 LAB — PREGNANCY, URINE: PREG TEST UR: NEGATIVE

## 2017-04-18 MED ORDER — ARIPIPRAZOLE 10 MG PO TABS
10.0000 mg | ORAL_TABLET | Freq: Once | ORAL | Status: AC
  Administered 2017-04-18: 10 mg via ORAL
  Filled 2017-04-18: qty 1

## 2017-04-18 MED ORDER — ARIPIPRAZOLE 10 MG PO TABS: 10.0000 mg | ORAL_TABLET | Freq: Every day | ORAL | 0 refills | Status: DC

## 2017-04-18 MED ORDER — CITALOPRAM HYDROBROMIDE 20 MG PO TABS
20.0000 mg | ORAL_TABLET | Freq: Every day | ORAL | Status: DC
  Administered 2017-04-18: 20 mg via ORAL
  Filled 2017-04-18: qty 1

## 2017-04-18 NOTE — ED Triage Notes (Signed)
Pt to ED via POV, pt stating that she has been feeling depressed lately. Pt states that feels like she would be better off dead, pt says that she has been feeling tired and weak and has recently stopped taking her medications for depression because she was "trying to be brave".   Pt calm and cooperative in triage.

## 2017-04-18 NOTE — ED Notes (Signed)
SOC machine set up in room 24 for pt privacy  Pt informed of the plan of care  She verbalizes agreement

## 2017-04-18 NOTE — ED Notes (Signed)
Pt observed lying in a hallway bed   Introduced myself   Appropriate to stimulation  No verbalized needs or concerns at this time  NAD assessed  Continue to monitor

## 2017-04-18 NOTE — ED Notes (Signed)
Lunch provided    BEHAVIORAL HEALTH ROUNDING Patient sleeping: No. Patient alert and oriented: yes Behavior appropriate: Yes.  ; If no, describe:  Nutrition and fluids offered: yes Toileting and hygiene offered: Yes  Sitter present: q15 minute observations and security monitoring Law enforcement present: Yes  ODS  ENVIRONMENTAL ASSESSMENT Potentially harmful objects out of patient reach: Yes.   Personal belongings secured: Yes.   Patient dressed in hospital provided attire only: Yes.   Plastic bags out of patient reach: Yes.   Patient care equipment (cords, cables, call bells, lines, and drains) shortened, removed, or accounted for: Yes.   Equipment and supplies removed from bottom of stretcher: Yes.   Potentially toxic materials out of patient reach: Yes.   Sharps container removed or out of patient reach: Yes.

## 2017-04-18 NOTE — ED Provider Notes (Addendum)
[facility] Emergency Department Provider Note  ____________________________________________   First MD Initiated Contact with Patient 04/18/17 1009     (approximate)  I have reviewed the triage vital signs and the nursing notes.   HISTORY  Chief Complaint Depression   HPI Maria Dickerson is a 36 y.o. female with a history of depression who is presenting to the emergency department today with suicidal thoughts. She says that she stopped taking her medications about 2 weeks ago because she was trying to "be stronger" and into her depression without them. However, she says that she has had family stressors that have caused her to have suicidal thoughts. She does not express a specific plan. Denies drinking or drug use.  Says that she occasionally smokes cigarettes  Past Medical History:  Diagnosis Date  . Depression   . Skin cancer     Patient Active Problem List   Diagnosis Date Noted  . Dysthymia 06/05/2016  . Borderline personality disorder 06/05/2016    Past Surgical History:  Procedure Laterality Date  . skin cancer removal      Prior to Admission medications   Medication Sig Start Date End Date Taking? Authorizing Provider  colchicine 0.6 MG tablet Take 1 tablet (0.6 mg total) by mouth daily. 03/15/17   Hinda Kehr, MD    Allergies Patient has no known allergies.  No family history on file.  Social History Social History  Substance Use Topics  . Smoking status: Current Every Day Smoker  . Smokeless tobacco: Never Used  . Alcohol use No    Review of Systems  Constitutional: No fever/chills Eyes: No visual changes. ENT: No sore throat. Cardiovascular: Denies chest pain. Respiratory: Denies shortness of breath. Gastrointestinal: No abdominal pain.  No nausea, no vomiting.  No diarrhea.  No constipation. Genitourinary: Negative for dysuria. Musculoskeletal: Negative for back pain. Skin: Negative for rash. Neurological:  Negative for headaches, focal weakness or numbness.   ____________________________________________   PHYSICAL EXAM:  VITAL SIGNS: ED Triage Vitals [04/18/17 0953]  Enc Vitals Group     BP (!) 131/96     Pulse Rate (!) 115     Resp 16     Temp 98 F (36.7 C)     Temp Source Oral     SpO2 98 %     Weight      Height      Head Circumference      Peak Flow      Pain Score      Pain Loc      Pain Edu?      Excl. in [place]?     Constitutional: Alert and oriented. Well appearing and in no acute distress. Eyes: Conjunctivae are normal.  Head: Atraumatic. Nose: No congestion/rhinnorhea. Mouth/Throat: Mucous membranes are moist.  Neck: No stridor.   Cardiovascular: Normal rate, regular rhythm. Grossly normal heart sounds.  Respiratory: Normal respiratory effort.  No retractions. Lungs CTAB. Gastrointestinal: Soft and nontender. No distention.  Musculoskeletal: No lower extremity tenderness nor edema.  No joint effusions. Neurologic:  Normal speech and language. No gross focal neurologic deficits are appreciated. Skin:  Skin is warm, dry and intact. No rash noted. Psychiatric: Mood and affect are normal. Speech and behavior are normal.  ____________________________________________   LABS (all labs ordered are listed, but only abnormal results are displayed)  Labs Reviewed  COMPREHENSIVE METABOLIC PANEL  ETHANOL  SALICYLATE LEVEL  ACETAMINOPHEN LEVEL  CBC  URINE DRUG SCREEN, QUALITATIVE ([place])  POC URINE  PREG, ED   ____________________________________________  EKG   ____________________________________________  RADIOLOGY   ____________________________________________   PROCEDURES  Procedure(s) performed:   Procedures  Critical Care performed:   ____________________________________________   INITIAL IMPRESSION / ASSESSMENT AND PLAN / ED COURSE  Pertinent labs & imaging results that were available during my care of the patient were reviewed by me  and considered in my medical decision making (see chart for details).  Patient IVC by me. She understands the need for psychiatric evaluation.      ____________________________________________   FINAL CLINICAL IMPRESSION(S) / ED DIAGNOSES  Depression. Suicidal thoughts.    NEW MEDICATIONS STARTED DURING THIS VISIT:  New Prescriptions   No medications on file     Note:  This document was prepared using Dragon voice recognition software and may include unintentional dictation errors.     Orbie Pyo, MD 04/18/17 1030  Patient evaluated by the specialist on call psychiatrist, Dr. Rolanda Jay who reverse the patient's involuntary commitment and believes the patient states to be discharged home. The patient will be restarting Celexa, stopping her Zyprexa and starting Abilify 10 mg every morning. The patient is understanding of this plan and willing to comply. I reassessed her and she is no longer suicidal. She also has an appointment with her primary care doctor scheduled for this Monday.    Orbie Pyo, MD 04/18/17 1328

## 2017-04-18 NOTE — ED Notes (Signed)
SOC complete.  

## 2017-04-18 NOTE — ED Notes (Signed)
BEHAVIORAL HEALTH ROUNDING Patient sleeping: No. Patient alert and oriented: yes Behavior appropriate: Yes.  ; If no, describe:  Nutrition and fluids offered: yes Toileting and hygiene offered: Yes  Sitter present: q15 minute observations and security  monitoring Law enforcement present: Yes  ODS  

## 2017-04-18 NOTE — ED Notes (Signed)
ED  Is the patient under IVC or is there intent for IVC: Yes.   Is the patient medically cleared: Yes.   Is there vacancy in the ED BHU: Yes.   Is the population mix appropriate for patient: Yes.   Is the patient awaiting placement in inpatient or outpatient setting:  Has the patient had a psychiatric consult:  Soc consult is currently taking place  Survey of unit performed for contraband, proper placement and condition of furniture, tampering with fixtures in bathroom, shower, and each patient room: Yes.  ; Findings:  APPEARANCE/BEHAVIOR Calm and cooperative NEURO ASSESSMENT Orientation: oriented x4  Denies pain Hallucinations: No.None noted (Hallucinations) Speech: Normal Gait: normal RESPIRATORY ASSESSMENT Even  Unlabored respirations  CARDIOVASCULAR ASSESSMENT Pulses equal   regular rate  Skin warm and dry   GASTROINTESTINAL ASSESSMENT no GI complaint EXTREMITIES Full ROM  PLAN OF CARE Provide calm/safe environment. Vital signs assessed twice daily. ED BHU Assessment once each 12-hour shift. Collaborate with tts daily or as condition indicates. Assure the ED provider has rounded once each shift. Provide and encourage hygiene. Provide redirection as needed. Assess for escalating behavior; address immediately and inform ED provider.  Assess family dynamic and appropriateness for visitation as needed: Yes.  ; If necessary, describe findings:  Educate the patient/family about BHU procedures/visitation: Yes.  ; If necessary, describe findings:

## 2017-04-20 LAB — URINE CULTURE

## 2017-06-15 ENCOUNTER — Ambulatory Visit: Payer: Self-pay | Admitting: Pharmacy Technician

## 2017-06-15 ENCOUNTER — Encounter (INDEPENDENT_AMBULATORY_CARE_PROVIDER_SITE_OTHER): Payer: Self-pay

## 2017-06-15 DIAGNOSIS — Z79899 Other long term (current) drug therapy: Secondary | ICD-10-CM

## 2017-06-15 NOTE — Progress Notes (Signed)
Completed [facility] application and contract.  Patient agreed to all terms of the [facility] contract.  Patient approved to receive medication assistance at [facility] through 2018, as long as eligibility criteria continues to be met.  Provided patient with community resource material based on her particular needs.    Betty J. Kluttz Care Manager [facility]  

## 2017-09-23 ENCOUNTER — Telehealth: Payer: Self-pay | Admitting: Pharmacy Technician

## 2017-09-23 NOTE — Telephone Encounter (Signed)
Received updated proof of income.  Patient eligible to receive medication assistance at [facility] through 2019, as long as eligibility requirements continue to be met.  [place] [facility]

## 2017-12-24 ENCOUNTER — Telehealth: Payer: Self-pay | Admitting: Pharmacy Technician

## 2017-12-24 NOTE — Telephone Encounter (Signed)
Patient has prescription drug coverage.  No longer eligible for [facility] services.  [place] [facility]

## 2018-04-27 ENCOUNTER — Emergency Department
Admission: EM | Admit: 2018-04-27 | Discharge: 2018-04-27 | Disposition: A | Discharge status: Home / Self Care | Payer: Medicare Other | Attending: Emergency Medicine | Admitting: Emergency Medicine

## 2018-04-27 ENCOUNTER — Other Ambulatory Visit: Payer: Self-pay

## 2018-04-27 ENCOUNTER — Emergency Department: Payer: Medicare Other

## 2018-04-27 DIAGNOSIS — F17210 Nicotine dependence, cigarettes, uncomplicated: Secondary | ICD-10-CM | POA: Diagnosis not present

## 2018-04-27 DIAGNOSIS — R51 Headache: Secondary | ICD-10-CM | POA: Diagnosis not present

## 2018-04-27 DIAGNOSIS — R519 Headache, unspecified: Secondary | ICD-10-CM

## 2018-04-27 DIAGNOSIS — R200 Anesthesia of skin: Secondary | ICD-10-CM | POA: Insufficient documentation

## 2018-04-27 DIAGNOSIS — R202 Paresthesia of skin: Secondary | ICD-10-CM | POA: Diagnosis not present

## 2018-04-27 LAB — POCT PREGNANCY, URINE: PREG TEST UR: NEGATIVE

## 2018-04-27 MED ORDER — BUTALBITAL-APAP-CAFFEINE 50-325-40 MG PO TABS
2.0000 | ORAL_TABLET | Freq: Once | ORAL | Status: AC
  Administered 2018-04-27: 2 via ORAL
  Filled 2018-04-27: qty 2

## 2018-04-27 MED ORDER — BUTALBITAL-APAP-CAFFEINE 50-325-40 MG PO TABS: 1.0000 | ORAL_TABLET | Freq: Four times a day (QID) | ORAL | 0 refills | Status: AC | PRN

## 2018-04-27 NOTE — ED Provider Notes (Signed)
[facility] Emergency Department Provider Note       Time seen: ----------------------------------------- 2:31 PM on 04/27/2018 -----------------------------------------   I have reviewed the triage vital signs and the nursing notes.  HISTORY   Chief Complaint Headache and Numbness    HPI Maria Dickerson is a 37 y.o. female with a history of depression, skin cancer who presents to the ED for numbness in her posterior scalp as well as recent headaches.  Patient describes headache with pressure occasionally to her head posteriorly.  Patient has a history of cancer of the scalp reports having 3 surgeries on the scalp in the distant past.  Her main concern is the numbness that has been present on her scalp.  She denies fevers, chills or other complaints.  Past Medical History:  Diagnosis Date  . Depression   . Skin cancer     Patient Active Problem List   Diagnosis Date Noted  . Dysthymia 06/05/2016  . Borderline personality disorder ([place]) 06/05/2016    Past Surgical History:  Procedure Laterality Date  . skin cancer removal      Allergies Patient has no known allergies.  Social History Social History   Tobacco Use  . Smoking status: Current Every Day Smoker  . Smokeless tobacco: Never Used  Substance Use Topics  . Alcohol use: No  . Drug use: No   Review of Systems Constitutional: Negative for fever. Cardiovascular: Negative for chest pain. Respiratory: Negative for shortness of breath. Gastrointestinal: Negative for abdominal pain, vomiting and diarrhea. usculoskeletal: Negative for back pain. Skin: Negative for rash. Neurological: Positive for headache, scalp numbness  All systems negative/normal/unremarkable except as stated in the HPI  ____________________________________________   PHYSICAL EXAM:  VITAL SIGNS: ED Triage Vitals  Enc Vitals Group     BP 04/27/18 1343 123/81     Pulse Rate 04/27/18 1343 86     Resp  04/27/18 1343 17     Temp 04/27/18 1343 (!) 97.5 F (36.4 C)     Temp Source 04/27/18 1343 Oral     SpO2 04/27/18 1343 97 %     Weight 04/27/18 1344 140 lb (63.5 kg)     Height 04/27/18 1344 5\' 2"  (1.575 m)     Head Circumference --      Peak Flow --      Pain Score 04/27/18 1343 0     Pain Loc --      Pain Edu? --      Excl. in [place]? --    Constitutional: Alert and oriented. Well appearing and in no distress. Eyes: Conjunctivae are normal. Normal extraocular movements.  No photophobia is noted Cardiovascular: Normal rate, regular rhythm. No murmurs, rubs, or gallops. Respiratory: Normal respiratory effort without tachypnea nor retractions. Breath sounds are clear and equal bilaterally. No wheezes/rales/rhonchi. Gastrointestinal: Soft and nontender. Normal bowel sounds Musculoskeletal: Nontender with normal range of motion in extremities. No lower extremity tenderness nor edema. Neurologic:  Normal speech and language.  There is decreased sensation over the occipital scalp along an area of previous surgery.  The remainder of sensation and cranial nerves are normal.  No meningeal signs Skin:  Skin is warm, dry and intact. No rash noted. Psychiatric: Mood and affect are normal. Speech and behavior are normal.  ___________________________________________  ED COURSE:  As part of my medical decision making, I reviewed the following data within the [place] History obtained from family if available, nursing notes, old chart and ekg, as well as  notes from prior ED visits. Patient presented for head pressure and numbness, patient looks very well clinically and this appears to be a chronic problem.  This will likely not require any further testing.   Procedures ____________________________________________   LABS (pertinent positives/negatives)  Labs Reviewed  POCT PREGNANCY, URINE    RADIOLOGY Images were viewed by me CT head IMPRESSION: Normal head  CT.  ____________________________________________  DIFFERENTIAL DIAGNOSIS   Postoperative numbness, paresthesia, migraine, tension headache  FINAL ASSESSMENT AND PLAN  Numbness, headache   Plan: The patient had presented for numbness on her scalp with some recent headache. Patient's imaging was unremarkable.  She looks very well clinically, she reports numbness has been present for the past 10 years and have advised this will not improve.  I will prescribe headache medicine she can take as needed but otherwise is cleared for outpatient follow-up.   Laurence Aly, MD   Note: This note was generated in part or whole with voice recognition software. Voice recognition is usually quite accurate but there are transcription errors that can and very often do occur. I apologize for any typographical errors that were not detected and corrected.     Earleen Newport, MD 04/27/18 [phone]

## 2018-04-27 NOTE — ED Notes (Signed)
First Nurse Note: Pt states that she is having numbness/tingling to her scalp and down her neck.

## 2018-04-27 NOTE — ED Notes (Signed)
Pt reports pain is all gone. Verbalized understanding of discharge instructions

## 2018-04-27 NOTE — ED Triage Notes (Addendum)
Pt c/o headache with a lot of pressure and numbness to the posterior head for the past 10 days with photophobia. Pt has a hx of cancer at the area of interest today. Pt c/o numbness to the scalp area, Not the face or other areas.

## 2018-07-05 IMAGING — CR DG CHEST 2V
2 series · 2 of 2 positions shown · non-contrast
Comparison: None.

CLINICAL DATA: Acute onset of left-sided chest pain. Initial
encounter.

EXAM:
CHEST  2 VIEW

[chest pa]
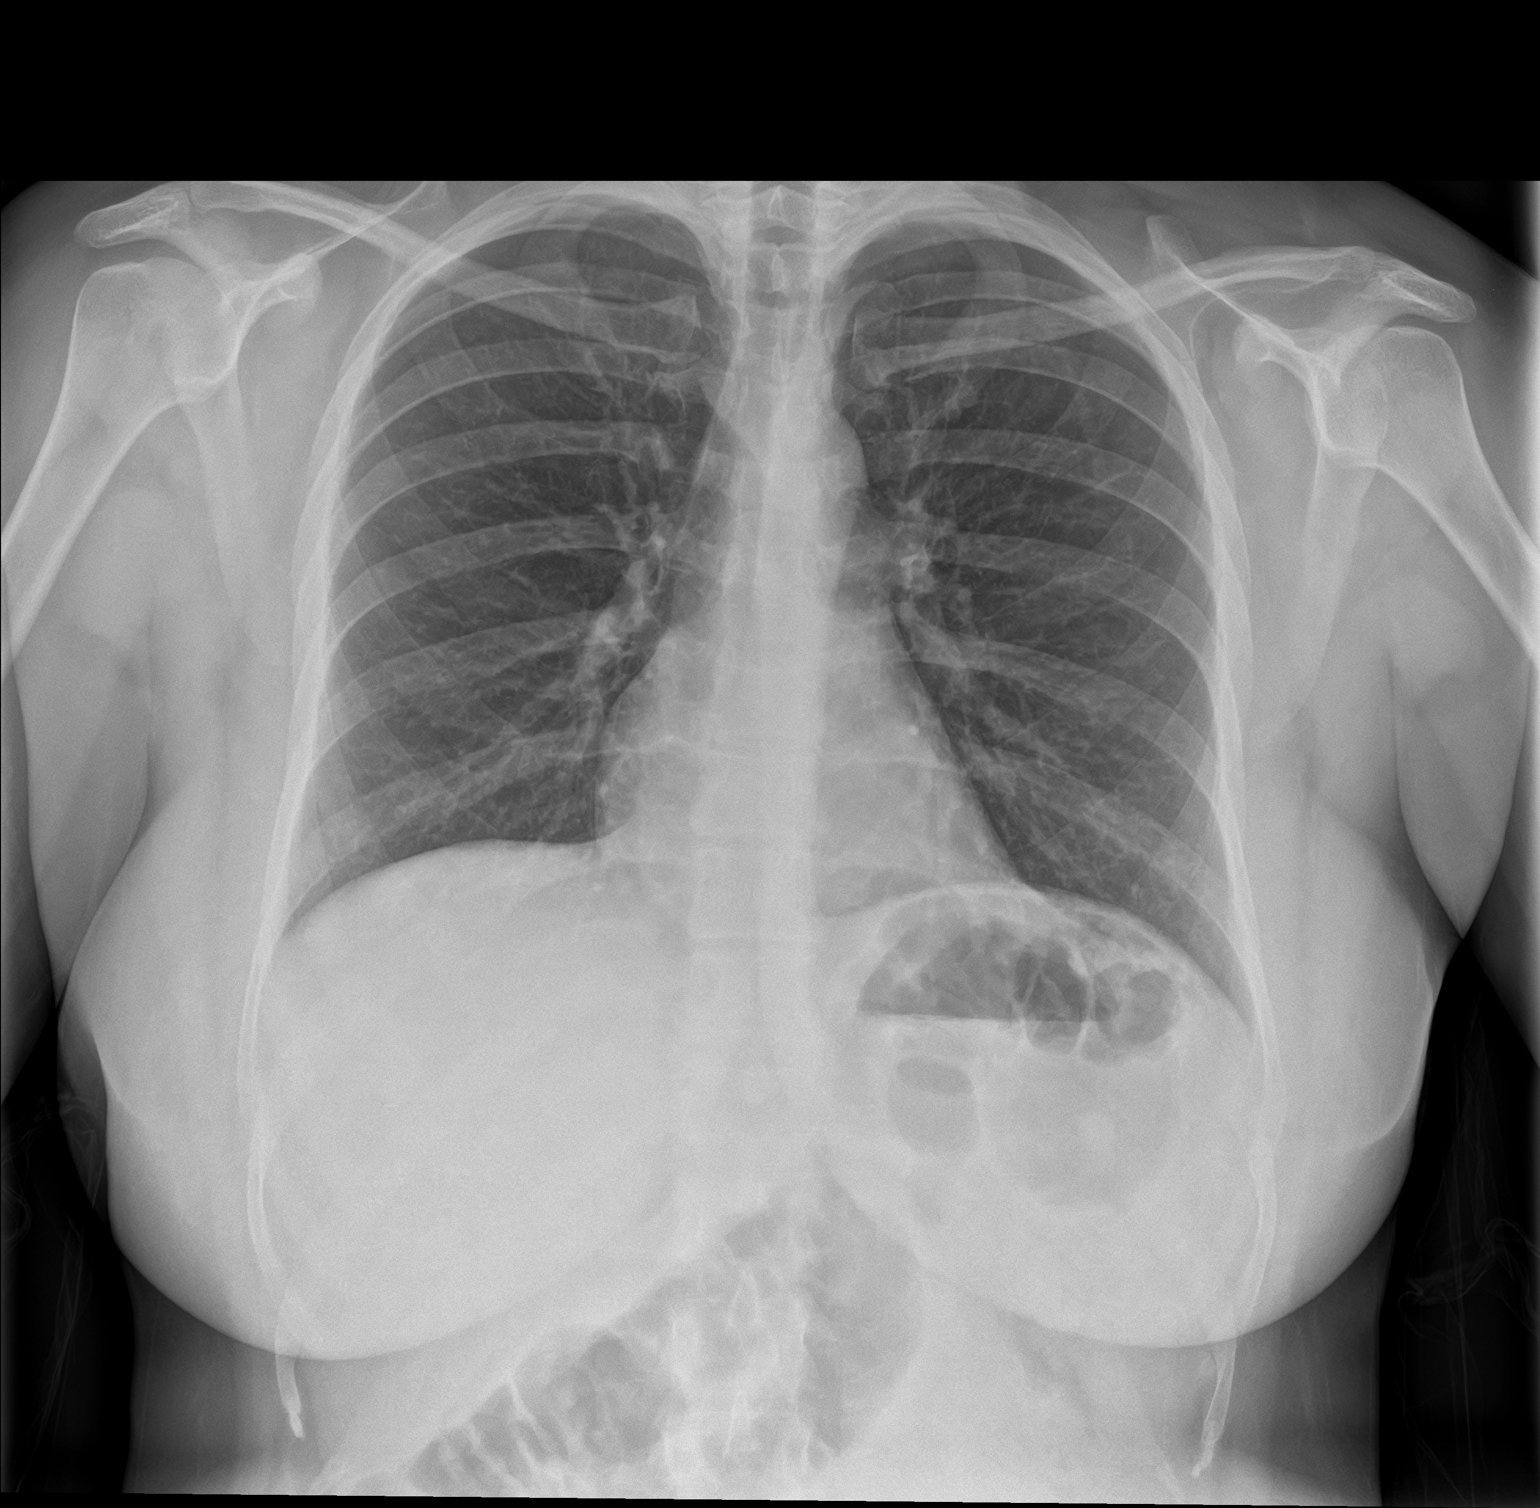

[chest lat]
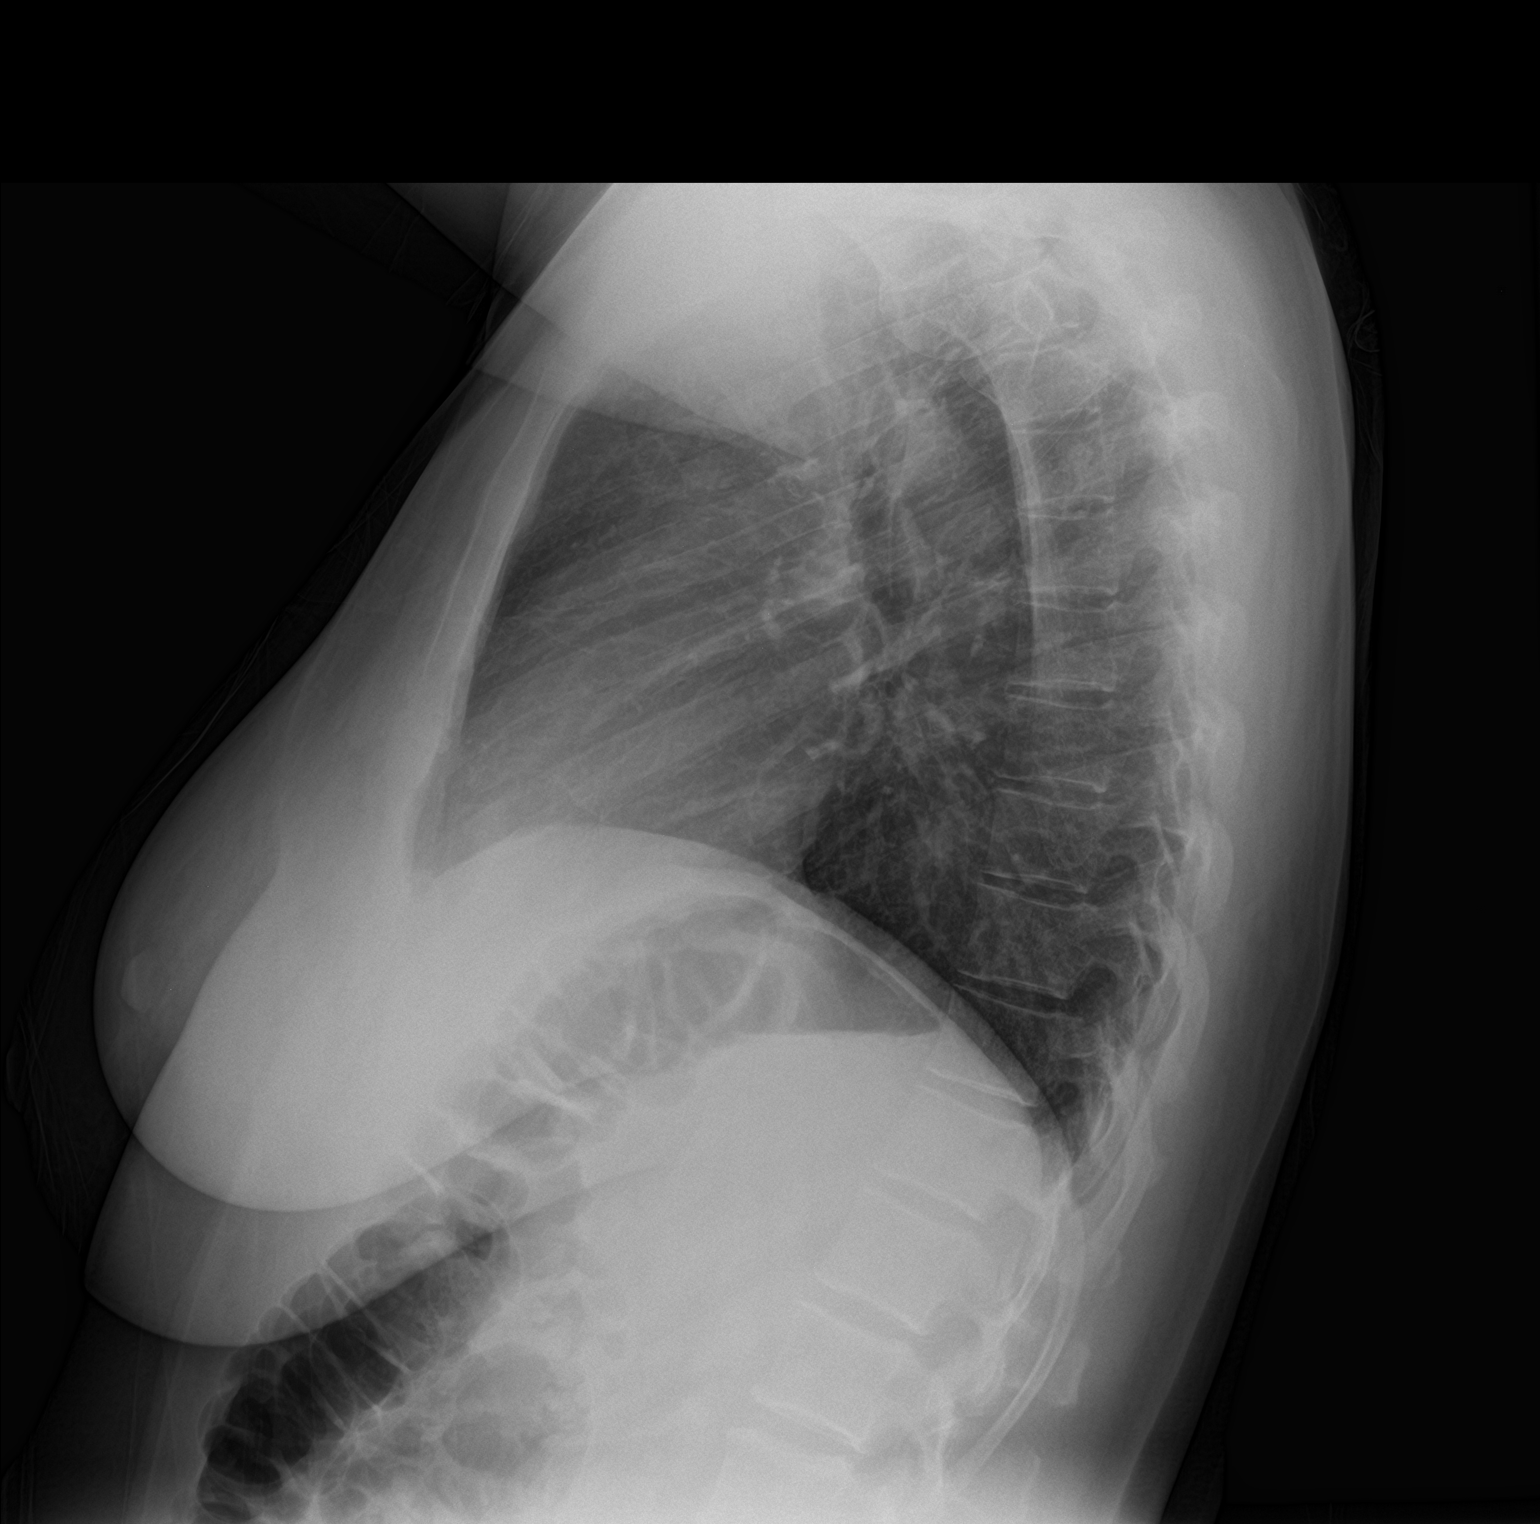

[2 of 2 positions shown; findings below may reference images not displayed]

FINDINGS: The lungs are well-aerated and clear. There is no evidence of focal
opacification, pleural effusion or pneumothorax.

The heart is normal in size; the mediastinal contour is within
normal limits. No acute osseous abnormalities are seen.
IMPRESSION: No acute cardiopulmonary process seen.

## 2019-01-29 ENCOUNTER — Emergency Department
Admission: EM | Admit: 2019-01-29 | Discharge: 2019-01-29 | Disposition: A | Discharge status: Home / Self Care | Payer: Medicare Other | Attending: Emergency Medicine | Admitting: Emergency Medicine

## 2019-01-29 ENCOUNTER — Other Ambulatory Visit: Payer: Self-pay

## 2019-01-29 ENCOUNTER — Encounter: Payer: Self-pay | Admitting: Emergency Medicine

## 2019-01-29 DIAGNOSIS — Z79899 Other long term (current) drug therapy: Secondary | ICD-10-CM | POA: Insufficient documentation

## 2019-01-29 DIAGNOSIS — J392 Other diseases of pharynx: Secondary | ICD-10-CM | POA: Diagnosis not present

## 2019-01-29 DIAGNOSIS — R070 Pain in throat: Secondary | ICD-10-CM | POA: Diagnosis present

## 2019-01-29 DIAGNOSIS — F17210 Nicotine dependence, cigarettes, uncomplicated: Secondary | ICD-10-CM | POA: Insufficient documentation

## 2019-01-29 MED ORDER — DIPHENHYDRAMINE HCL 12.5 MG/5ML PO ELIX
12.5000 mg | ORAL_SOLUTION | Freq: Once | ORAL | Status: AC
  Administered 2019-01-29: 12.5 mg via ORAL
  Filled 2019-01-29: qty 5

## 2019-01-29 MED ORDER — LIDOCAINE VISCOUS HCL 2 % MT SOLN
15.0000 mL | Freq: Once | OROMUCOSAL | Status: AC
  Administered 2019-01-29: 15 mL via OROMUCOSAL
  Filled 2019-01-29: qty 15

## 2019-01-29 MED ORDER — LIDOCAINE VISCOUS HCL 2 % MT SOLN: 5.0000 mL | Freq: Four times a day (QID) | OROMUCOSAL | 0 refills | Status: DC | PRN

## 2019-01-29 NOTE — Discharge Instructions (Signed)
Follow discharge care instructions and take medication as directed. 

## 2019-01-29 NOTE — ED Provider Notes (Signed)
[facility] Emergency Department Provider Note   ____________________________________________   First MD Initiated Contact with Patient 01/29/19 2214     (approximate)  I have reviewed the triage vital signs and the nursing notes.   HISTORY  Chief Complaint Sore Throat    HPI Maria Dickerson is a 38 y.o. female patient presents with sore throat secondary to having a pill stuck in her throat.  Patient was trying to take a pill for water.  Patient stated now has throat irritation.  Patient was taking Aleve for headache.  Patient denies URI signs and symptoms.  Patient states no true pain just irritation when she swallowed.  Patient able tolerate food and fluids.         Past Medical History:  Diagnosis Date  . Depression   . Skin cancer     Patient Active Problem List   Diagnosis Date Noted  . Dysthymia 06/05/2016  . Borderline personality disorder ([place]) 06/05/2016    Past Surgical History:  Procedure Laterality Date  . skin cancer removal      Prior to Admission medications   Medication Sig Start Date End Date Taking? Authorizing Provider  ARIPiprazole (ABILIFY) 10 MG tablet Take 1 tablet (10 mg total) by mouth daily. 04/18/17   Schaevitz, Randall An, MD  butalbital-acetaminophen-caffeine (FIORICET, ESGIC) [phone] MG tablet Take 1-2 tablets by mouth every 6 (six) hours as needed. 04/27/18 04/27/19  Earleen Newport, MD  colchicine 0.6 MG tablet Take 1 tablet (0.6 mg total) by mouth daily. 03/15/17   Hinda Kehr, MD  lidocaine (XYLOCAINE) 2 % solution Use as directed 5 mLs in the mouth or throat every 6 (six) hours as needed for mouth pain. Question swallow as needed for sore throat. 01/29/19   Sable Feil, PA-C    Allergies Patient has no known allergies.  History reviewed. No pertinent family history.  Social History Social History   Tobacco Use  . Smoking status: Current Some Day Smoker    Types: Cigarettes  .  Smokeless tobacco: Never Used  Substance Use Topics  . Alcohol use: No  . Drug use: No    Review of Systems Constitutional: No fever/chills Eyes: No visual changes. ENT: Sore throat.  Cardiovascular: Denies chest pain. Respiratory: Denies shortness of breath. Gastrointestinal: No abdominal pain.  No nausea, no vomiting.  No diarrhea.  No constipation. Genitourinary: Negative for dysuria. Musculoskeletal: Negative for back pain. Skin: Negative for rash. Neurological: Negative for headaches, focal weakness or numbness.   ____________________________________________   PHYSICAL EXAM:  VITAL SIGNS: ED Triage Vitals  Enc Vitals Group     BP 01/29/19 2121 (!) 136/94     Pulse Rate 01/29/19 2121 84     Resp 01/29/19 2121 18     Temp 01/29/19 2121 98.5 F (36.9 C)     Temp Source 01/29/19 2121 Oral     SpO2 01/29/19 2121 97 %     Weight 01/29/19 2122 145 lb (65.8 kg)     Height 01/29/19 2122 5\' 2"  (1.575 m)     Head Circumference --      Peak Flow --      Pain Score 01/29/19 2122 0     Pain Loc --      Pain Edu? --      Excl. in [place]? --     Constitutional: Alert and oriented. Well appearing and in no acute distress. Mouth/Throat: Mucous membranes are moist.  Oropharynx non-erythematous. Neck: No stridor.  Hematological/Lymphatic/Immunilogical:  No cervical lymphadenopathy. Cardiovascular: Normal rate, regular rhythm. Grossly normal heart sounds.  Good peripheral circulation. Respiratory: Normal respiratory effort.  No retractions. Lungs CTAB.  ____________________________________________   LABS (all labs ordered are listed, but only abnormal results are displayed)  Labs Reviewed - No data to display ____________________________________________  EKG   ____________________________________________  RADIOLOGY  ED MD interpretation:    Official radiology report(s): No results found.  ____________________________________________   PROCEDURES  Procedure(s)  performed (including Critical Care):  Procedures   ____________________________________________   INITIAL IMPRESSION / ASSESSMENT AND PLAN / ED COURSE  As part of my medical decision making, I reviewed the following data within the [place] was evaluated in Emergency Department on 01/29/2019 for the symptoms described in the history of present illness. She was evaluated in the context of the global COVID-19 pandemic, which necessitated consideration that the patient might be at risk for infection with the SARS-CoV-2 virus that causes COVID-19. Institutional protocols and algorithms that pertain to the evaluation of patients at risk for COVID-19 are in a state of rapid change based on information released by regulatory bodies including the CDC and federal and state organizations. These policies and algorithms were followed during the patient's care in the ED.      Patient presents with sore throat secondary trying to swallow pills without water.  Patient state more irritation than pain.  Patient given discharge care instruction.  Patient given viscous lidocaine mix with Benadryl elixir prior to departure.  Patient advised follow-up PCP as needed. ____________________________________________   FINAL CLINICAL IMPRESSION(S) / ED DIAGNOSES  Final diagnoses:  Throat irritation     ED Discharge Orders         Ordered    lidocaine (XYLOCAINE) 2 % solution  Every 6 hours PRN     01/29/19 2225           Note:  This document was prepared using Dragon voice recognition software and may include unintentional dictation errors.    Sable Feil, PA-C 01/29/19 2227    Duffy Bruce, MD 01/30/19 [phone]

## 2019-01-29 NOTE — ED Triage Notes (Signed)
Pt to ED from home c/o sore throat and cough today.  States took some aleve today but suddenly had feeling in her throat like they didn't go down and then started coughing.  Pt able to drink more water but throat was still irritated, denies n/v/d, denies productive cough.  Denies fevers, denies loss of taste or smell.  Pt speaking in complete and coherent sentences, chest rise even and unlabored, in NAD at this time.

## 2019-02-03 ENCOUNTER — Emergency Department
Admission: EM | Admit: 2019-02-03 | Discharge: 2019-02-03 | Discharge status: Against Medical Advice | Payer: Medicare Other

## 2019-02-03 ENCOUNTER — Other Ambulatory Visit: Payer: Self-pay

## 2019-08-17 IMAGING — CT CT HEAD W/O CM
3 of 4 series · 16 of 47 positions shown, 19 images · non-contrast
Comparison: None.

CLINICAL DATA: Numbness, paresthesia.

EXAM:
CT HEAD WITHOUT CONTRAST
TECHNIQUE: Contiguous axial images were obtained from the base of the skull
through the vertex without intravenous contrast.

[Series 2: head wo · axial · 0.42mm/px · z∈[+210,+340]mm · 10 of 31 slices shown, 13 images]
[im 3/31  brain]
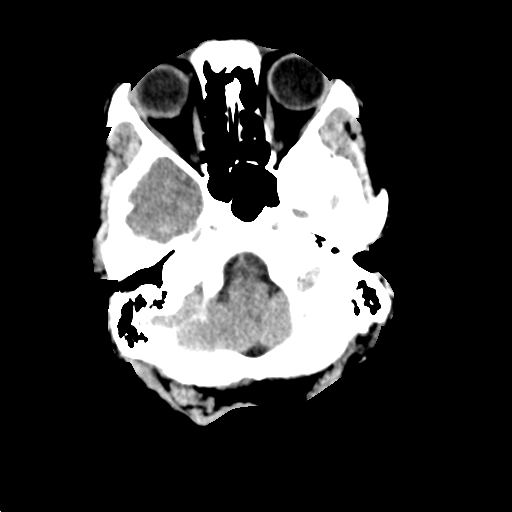
[im 3/31  bone]
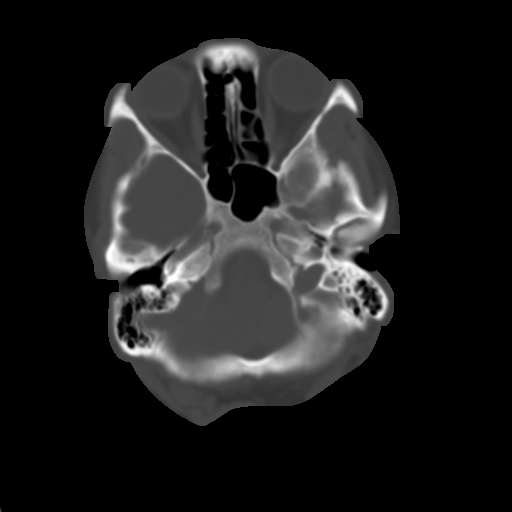
[im 7/31  brain]
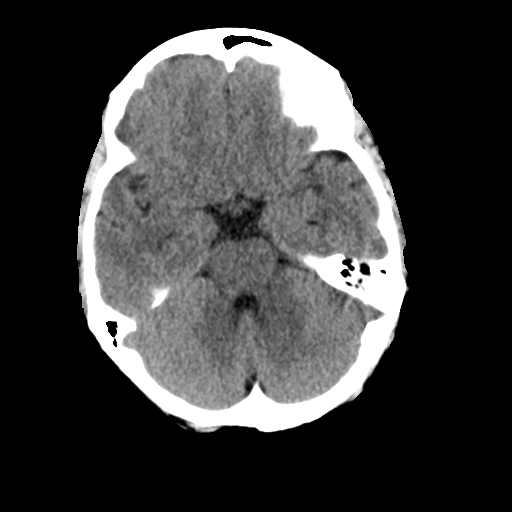
[im 9/31  brain]
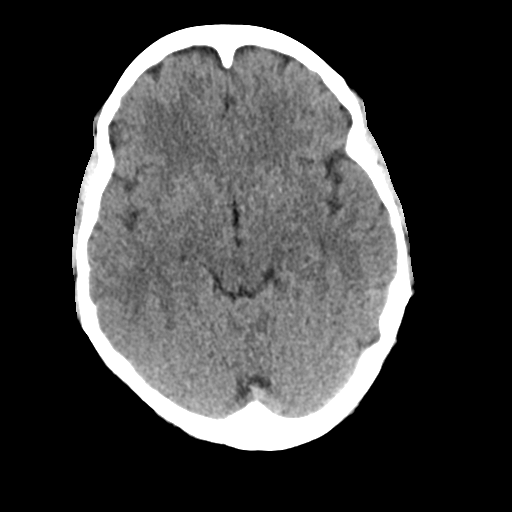
[im 11/31  brain]
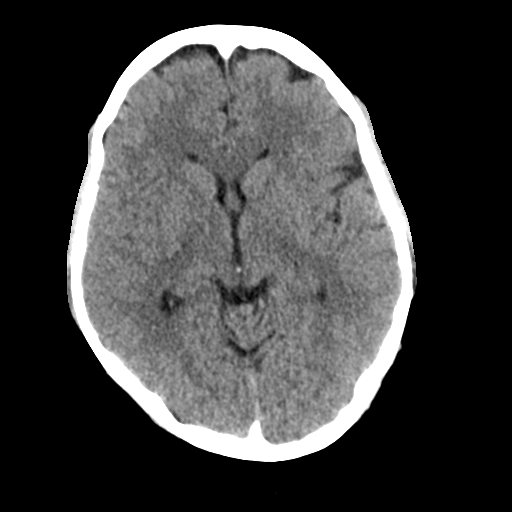
[im 15/31  brain]
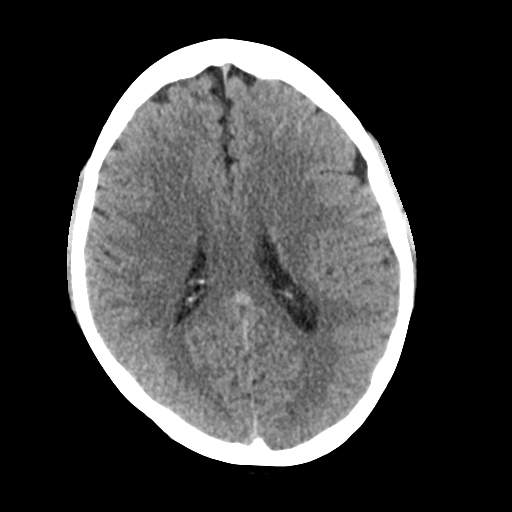
[im 15/31  bone]
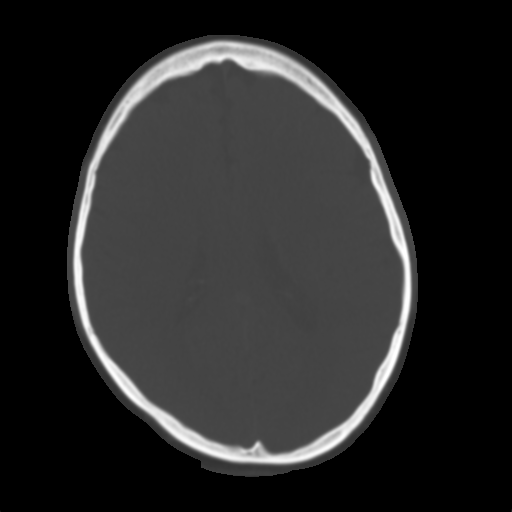
[im 17/31  brain]
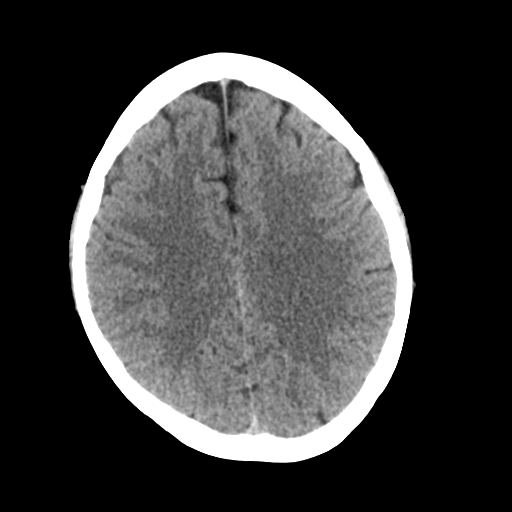
[im 21/31  brain]
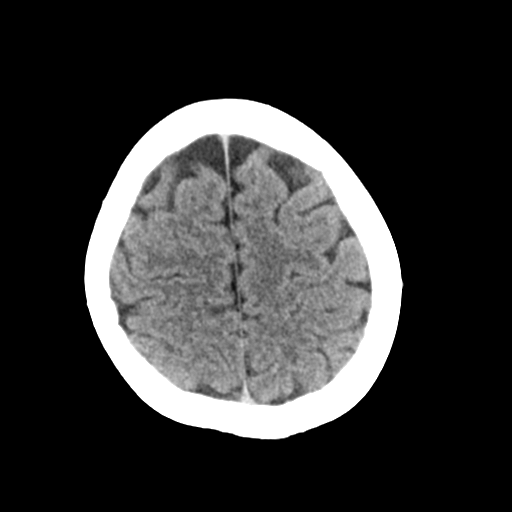
[im 23/31  brain]
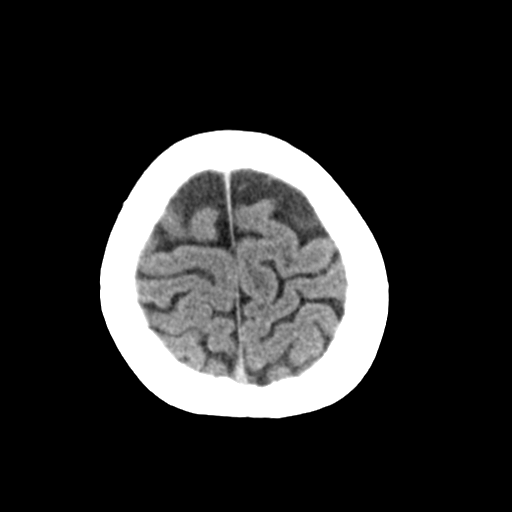
[im 25/31  brain]
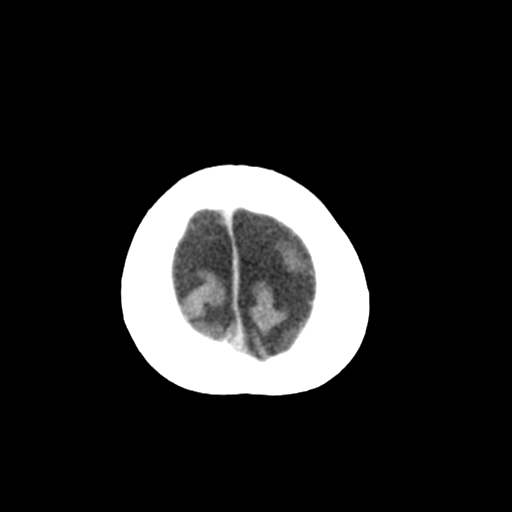
[im 25/31  bone]
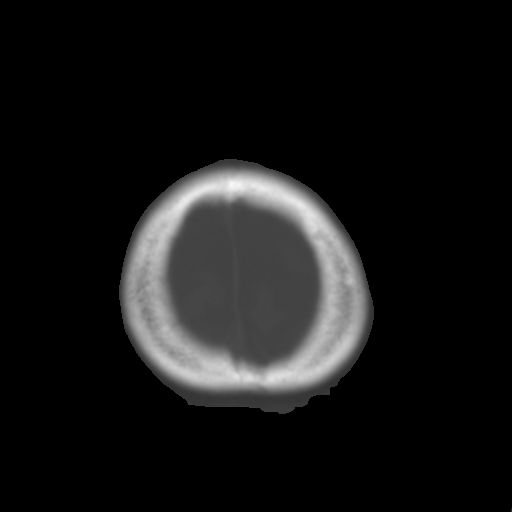
[im 29/31  brain]
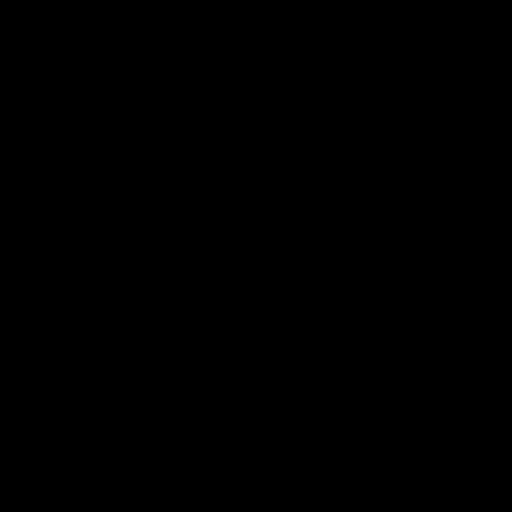

[Series 5: coronal soft tissue · coronal · 0.30mm/px · 3 of 60 slices shown]
[im 20/60  brain]
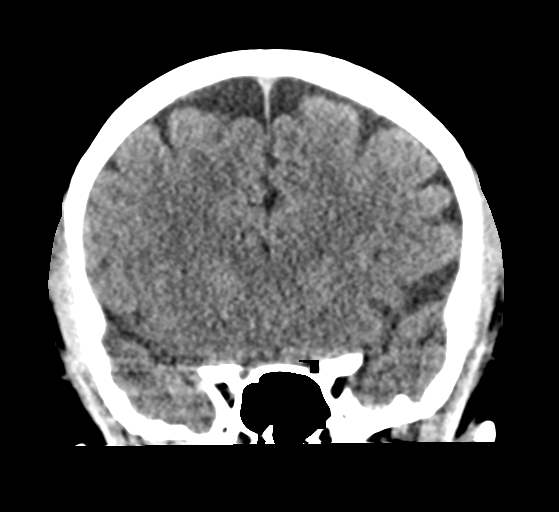
[im 27/60  brain]
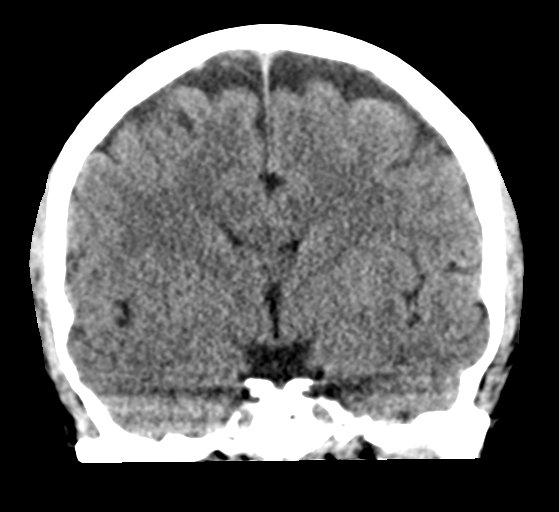
[im 33/60  brain]
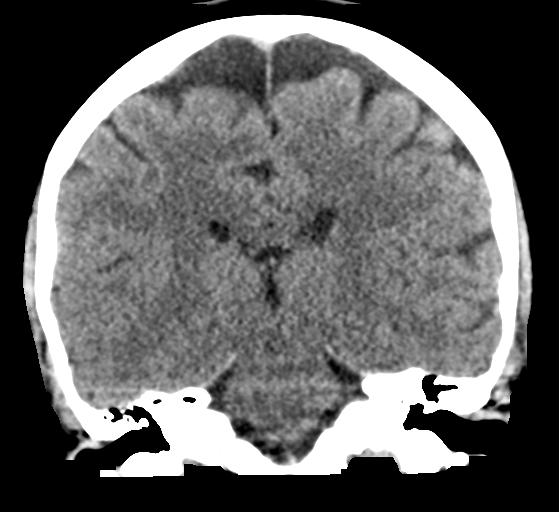

[Series 6: sagittal soft tissue · sagittal · 0.31mm/px · 3 of 51 slices shown]
[im 17/51  brain]
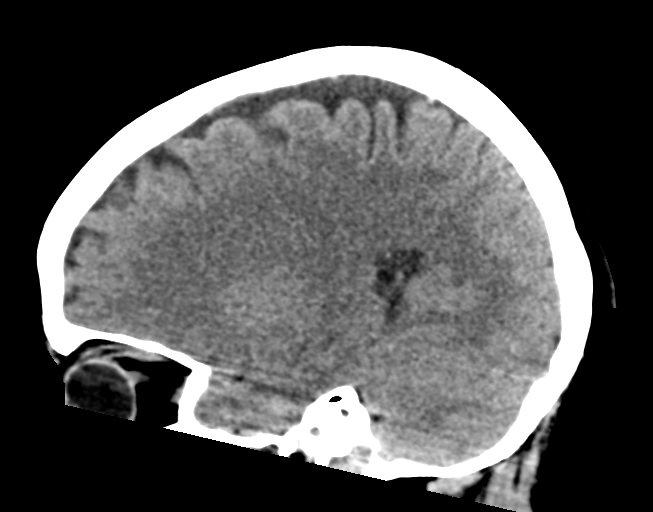
[im 26/51  brain]
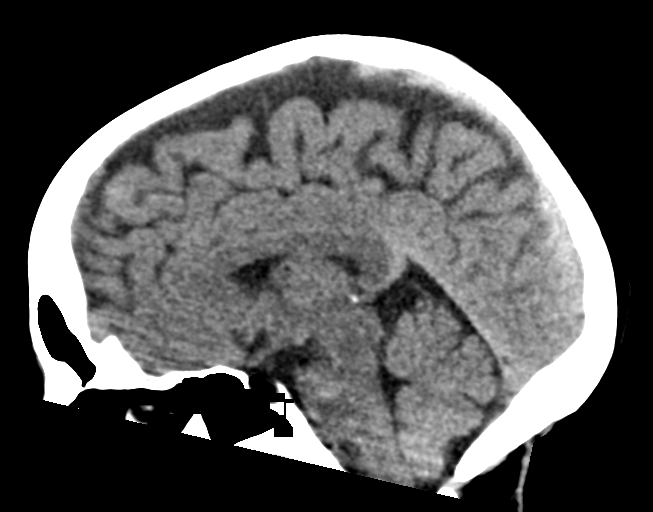
[im 34/51  brain]
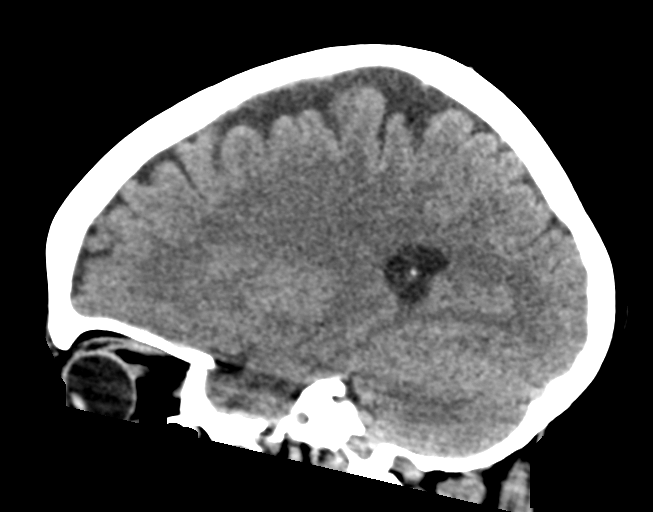

[16 of 47 positions shown; findings below may reference images not displayed]

FINDINGS: Brain: No evidence of acute infarction, hemorrhage, hydrocephalus,
extra-axial collection or mass lesion/mass effect.

Vascular: No hyperdense vessel or unexpected calcification.

Skull: Normal. Negative for fracture or focal lesion.

Sinuses/Orbits: No acute finding.

Other: None.
IMPRESSION: Normal head CT.

## 2019-09-20 ENCOUNTER — Emergency Department
Admission: EM | Admit: 2019-09-20 | Discharge: 2019-09-20 | Discharge status: Against Medical Advice | Payer: Medicare Other

## 2019-09-20 NOTE — ED Notes (Signed)
Pt walks up to First RN desk and tells this RN that she just wants to go home, she states that she needs to pick up her prescription and will be fine. Pt advised it was time to take her back for triage and she declined. Pt calm and cooperative. This RN inquired about pts depression and asked if she was SI/HI, pt denies any of these sx's.

## 2019-12-03 ENCOUNTER — Emergency Department: Payer: Medicare Other

## 2019-12-03 ENCOUNTER — Other Ambulatory Visit: Payer: Self-pay

## 2019-12-03 ENCOUNTER — Emergency Department
Admission: EM | Admit: 2019-12-03 | Discharge: 2019-12-03 | Disposition: A | Discharge status: Home / Self Care | Payer: Medicare Other | Attending: Emergency Medicine | Admitting: Emergency Medicine

## 2019-12-03 DIAGNOSIS — Z85828 Personal history of other malignant neoplasm of skin: Secondary | ICD-10-CM | POA: Diagnosis not present

## 2019-12-03 DIAGNOSIS — R05 Cough: Secondary | ICD-10-CM | POA: Diagnosis present

## 2019-12-03 DIAGNOSIS — R0981 Nasal congestion: Secondary | ICD-10-CM | POA: Insufficient documentation

## 2019-12-03 DIAGNOSIS — F17210 Nicotine dependence, cigarettes, uncomplicated: Secondary | ICD-10-CM | POA: Insufficient documentation

## 2019-12-03 DIAGNOSIS — U071 COVID-19: Secondary | ICD-10-CM | POA: Insufficient documentation

## 2019-12-03 DIAGNOSIS — J069 Acute upper respiratory infection, unspecified: Secondary | ICD-10-CM | POA: Insufficient documentation

## 2019-12-03 NOTE — ED Triage Notes (Signed)
Pt states her brother had cough and congestion and now she has it and is scared and wants to get checked out. A&O, ambulatory. C/o hoarse voice.

## 2019-12-03 NOTE — ED Provider Notes (Signed)
Emergency Department Provider Note  ____________________________________________  Time seen: Approximately 7:15 PM  I have reviewed the triage vital signs and the nursing notes.   HISTORY  Chief Complaint Nasal Congestion and Cough   Historian Patient     HPI Maria Dickerson is a 39 y.o. female presents to the emergency department with sporadic cough, nasal congestion and loss of voice over the past 1 to 2 days.  Patient's brother had similar symptoms and she became concerned for COVID-19.  She denies chest pain, chest tightness or shortness of breath.  No fever at home.  No emesis or diarrhea.  Patient denies pharyngitis.  No other alleviating measures attempted.    Past Medical History:  Diagnosis Date  . Depression   . Skin cancer      Immunizations up to date:  Yes.     Past Medical History:  Diagnosis Date  . Depression   . Skin cancer     Patient Active Problem List   Diagnosis Date Noted  . Dysthymia 06/05/2016  . Borderline personality disorder ([place]) 06/05/2016    Past Surgical History:  Procedure Laterality Date  . skin cancer removal      Prior to Admission medications   Medication Sig Start Date End Date Taking? Authorizing Provider  ARIPiprazole (ABILIFY) 10 MG tablet Take 1 tablet (10 mg total) by mouth daily. 04/18/17   Schaevitz, Randall An, MD  colchicine 0.6 MG tablet Take 1 tablet (0.6 mg total) by mouth daily. 03/15/17   Hinda Kehr, MD  lidocaine (XYLOCAINE) 2 % solution Use as directed 5 mLs in the mouth or throat every 6 (six) hours as needed for mouth pain. Question swallow as needed for sore throat. 01/29/19   Sable Feil, PA-C    Allergies Patient has no known allergies.  History reviewed. No pertinent family history.  Social History Social History   Tobacco Use  . Smoking status: Current Some Day Smoker    Types: Cigarettes  . Smokeless tobacco: Never Used  Substance Use Topics  . Alcohol use: No  . Drug use:  No     Review of Systems  Constitutional: No fever/chills Eyes:  No discharge ENT: No upper respiratory complaints. Respiratory: Patient has sporadic cough and nasal congestion.  Gastrointestinal:   No nausea, no vomiting.  No diarrhea.  No constipation. Musculoskeletal: Negative for musculoskeletal pain. Skin: Negative for rash, abrasions, lacerations, ecchymosis.    ____________________________________________   PHYSICAL EXAM:  VITAL SIGNS: ED Triage Vitals [12/03/19 1625]  Enc Vitals Group     BP (!) 138/100     Pulse Rate (!) 125     Resp 18     Temp 99 F (37.2 C)     Temp Source Oral     SpO2 96 %     Weight 160 lb (72.6 kg)     Height 5\' 2"  (1.575 m)     Head Circumference      Peak Flow      Pain Score 0     Pain Loc      Pain Edu?      Excl. in [place]?      Constitutional: Alert and oriented. Patient is lying supine. Eyes: Conjunctivae are normal. PERRL. EOMI. Head: Atraumatic. ENT:      Ears: Tympanic membranes are mildly injected with mild effusion bilaterally.       Nose: No congestion/rhinnorhea.      Mouth/Throat: Mucous membranes are moist. Posterior pharynx is mildly erythematous.  Hematological/Lymphatic/Immunilogical:  No cervical lymphadenopathy.  Cardiovascular: Normal rate, regular rhythm. Normal S1 and S2.  Good peripheral circulation. Respiratory: Normal respiratory effort without tachypnea or retractions. Lungs CTAB. Good air entry to the bases with no decreased or absent breath sounds. Gastrointestinal: Bowel sounds 4 quadrants. Soft and nontender to palpation. No guarding or rigidity. No palpable masses. No distention. No CVA tenderness. Musculoskeletal: Full range of motion to all extremities. No gross deformities appreciated. Neurologic:  Normal speech and language. No gross focal neurologic deficits are appreciated.  Skin:  Skin is warm, dry and intact. No rash noted. Psychiatric: Mood and affect are normal. Speech and behavior are  normal. Patient exhibits appropriate insight and judgement.    ____________________________________________   LABS (all labs ordered are listed, but only abnormal results are displayed)  Labs Reviewed  SARS CORONAVIRUS 2 (TAT 6-24 HRS)   ____________________________________________  EKG   ____________________________________________  RADIOLOGY Unk Pinto, personally viewed and evaluated these images (plain radiographs) as part of my medical decision making, as well as reviewing the written report by the radiologist.  DG Chest 1 View  Result Date: 12/03/2019 CLINICAL DATA:  Pt states no chest complaints but wanted to get a check up. EXAM: CHEST  1 VIEW COMPARISON:  Chest radiograph 03/15/2017 FINDINGS: The heart size and mediastinal contours are within normal limits. The lungs are clear. No pneumothorax or significant pleural effusion. The visualized skeletal structures are unremarkable. IMPRESSION: No acute cardiopulmonary finding. Electronically Signed   By: Audie Pinto M.D.   On: 12/03/2019 18:16    ____________________________________________    PROCEDURES  Procedure(s) performed:     Procedures     Medications - No data to display   ____________________________________________   INITIAL IMPRESSION / ASSESSMENT AND PLAN / ED COURSE  Pertinent labs & imaging results that were available during my care of the patient were reviewed by me and considered in my medical decision making (see chart for details).      Assessment and plan Viral URI 39 year old female presents to the emergency department with sporadic cough and nasal congestion for the past 2 days.  Patient was tachycardic at triage with low-grade fever but vital signs were otherwise reassuring.  Chest x-ray revealed no consolidations, opacities or infiltrates.  Send off COVID-19 testing is in process at this time.  Rest and hydration were encouraged.  Tylenol and ibuprofen alternating for  fever recommended.  Return precautions were given to return with new or worsening symptoms.     ____________________________________________  FINAL CLINICAL IMPRESSION(S) / ED DIAGNOSES  Final diagnoses:  Viral upper respiratory tract infection      NEW MEDICATIONS STARTED DURING THIS VISIT:  ED Discharge Orders    None          This chart was dictated using voice recognition software/Dragon. Despite best efforts to proofread, errors can occur which can change the meaning. Any change was purely unintentional.     Lannie Fields, PA-C 12/03/19 1918    Carrie Mew, MD 12/03/19 [phone]

## 2019-12-04 LAB — SARS CORONAVIRUS 2 (TAT 6-24 HRS): SARS Coronavirus 2: POSITIVE — AB

## 2019-12-05 ENCOUNTER — Telehealth: Payer: Self-pay | Admitting: Emergency Medicine

## 2019-12-05 NOTE — Telephone Encounter (Signed)
Called to inform of positive covid test.  She understands isolation/[place]arn[place] cdc guidelines.

## 2021-05-25 ENCOUNTER — Other Ambulatory Visit: Payer: Self-pay

## 2021-05-25 ENCOUNTER — Emergency Department
Admission: EM | Admit: 2021-05-25 | Discharge: 2021-05-27 | Disposition: A | Discharge status: Home / Self Care | Payer: Medicare Other | Attending: Emergency Medicine | Admitting: Emergency Medicine

## 2021-05-25 DIAGNOSIS — F17210 Nicotine dependence, cigarettes, uncomplicated: Secondary | ICD-10-CM | POA: Diagnosis not present

## 2021-05-25 DIAGNOSIS — R45851 Suicidal ideations: Secondary | ICD-10-CM | POA: Insufficient documentation

## 2021-05-25 DIAGNOSIS — Z85828 Personal history of other malignant neoplasm of skin: Secondary | ICD-10-CM | POA: Diagnosis not present

## 2021-05-25 DIAGNOSIS — F314 Bipolar disorder, current episode depressed, severe, without psychotic features: Secondary | ICD-10-CM | POA: Diagnosis not present

## 2021-05-25 DIAGNOSIS — Z20822 Contact with and (suspected) exposure to covid-19: Secondary | ICD-10-CM | POA: Diagnosis not present

## 2021-05-25 DIAGNOSIS — Z79899 Other long term (current) drug therapy: Secondary | ICD-10-CM | POA: Insufficient documentation

## 2021-05-25 DIAGNOSIS — Y900 Blood alcohol level of less than 20 mg/100 ml: Secondary | ICD-10-CM | POA: Diagnosis not present

## 2021-05-25 LAB — COMPREHENSIVE METABOLIC PANEL
ALT: 28 U/L (ref 0–44)
AST: 32 U/L (ref 15–41)
Albumin: 5 g/dL (ref 3.5–5.0)
Alkaline Phosphatase: 71 U/L (ref 38–126)
Anion gap: 9 (ref 5–15)
BUN: 6 mg/dL (ref 6–20)
CO2: 22 mmol/L (ref 22–32)
Calcium: 9.2 mg/dL (ref 8.9–10.3)
Chloride: 103 mmol/L (ref 98–111)
Creatinine, Ser: 0.74 mg/dL (ref 0.44–1.00)
GFR, Estimated: >60 mL/min (ref >60)
Glucose, Bld: 190 mg/dL — ABNORMAL HIGH (ref 70–99)
Potassium: 3.8 mmol/L (ref 3.5–5.1)
Sodium: 134 mmol/L — ABNORMAL LOW (ref 135–145)
Total Bilirubin: 0.4 mg/dL (ref 0.3–1.2)
Total Protein: 8.5 g/dL — ABNORMAL HIGH (ref 6.5–8.1)

## 2021-05-25 LAB — SALICYLATE LEVEL: Salicylate Lvl: <7 mg/dL — ABNORMAL LOW (ref 7.0–30.0)

## 2021-05-25 LAB — URINE DRUG SCREEN, QUALITATIVE (ARMC ONLY)
Amphetamines, Ur Screen: NOT DETECTED
Barbiturates, Ur Screen: NOT DETECTED
Benzodiazepine, Ur Scrn: NOT DETECTED
Cannabinoid 50 Ng, Ur ~~LOC~~: NOT DETECTED
Cocaine Metabolite,Ur ~~LOC~~: NOT DETECTED
MDMA (Ecstasy)Ur Screen: NOT DETECTED
Methadone Scn, Ur: NOT DETECTED
Opiate, Ur Screen: NOT DETECTED
Phencyclidine (PCP) Ur S: NOT DETECTED
Tricyclic, Ur Screen: NOT DETECTED

## 2021-05-25 LAB — CBC
HCT: 38.7 % (ref 36.0–46.0)
Hemoglobin: 13.9 g/dL (ref 12.0–15.0)
MCH: 31.2 pg (ref 26.0–34.0)
MCHC: 35.9 g/dL (ref 30.0–36.0)
MCV: 87 fL (ref 80.0–100.0)
Platelets: 298 10*3/uL (ref 150–400)
RBC: 4.45 MIL/uL (ref 3.87–5.11)
RDW: 12.2 % (ref 11.5–15.5)
WBC: 12.2 10*3/uL — ABNORMAL HIGH (ref 4.0–10.5)
nRBC: 0 % (ref 0.0–0.2)

## 2021-05-25 LAB — RESP PANEL BY RT-PCR (FLU A&B, COVID) ARPGX2
Influenza A by PCR: NEGATIVE
Influenza B by PCR: NEGATIVE
SARS Coronavirus 2 by RT PCR: NEGATIVE

## 2021-05-25 LAB — ACETAMINOPHEN LEVEL: Acetaminophen (Tylenol), Serum: <10 ug/mL — ABNORMAL LOW (ref 10–30)

## 2021-05-25 LAB — ETHANOL: Alcohol, Ethyl (B): <10 mg/dL (ref <10)

## 2021-05-25 LAB — PREGNANCY, URINE: Preg Test, Ur: NEGATIVE

## 2021-05-25 MED ORDER — OXCARBAZEPINE 300 MG PO TABS
300.0000 mg | ORAL_TABLET | Freq: Two times a day (BID) | ORAL | Status: DC
  Administered 2021-05-25 – 2021-05-27 (×4): 300 mg via ORAL
  Filled 2021-05-25 (×6): qty 1

## 2021-05-25 MED ORDER — CITALOPRAM HYDROBROMIDE 20 MG PO TABS
20.0000 mg | ORAL_TABLET | Freq: Every morning | ORAL | Status: DC
  Administered 2021-05-26 – 2021-05-27 (×2): 20 mg via ORAL
  Filled 2021-05-25 (×2): qty 1

## 2021-05-25 MED ORDER — OLANZAPINE 5 MG PO TABS
15.0000 mg | ORAL_TABLET | Freq: Every day | ORAL | Status: DC
  Administered 2021-05-25 – 2021-05-26 (×2): 15 mg via ORAL
  Filled 2021-05-25 (×2): qty 1
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
## 2021-05-25 NOTE — ED Provider Notes (Addendum)
[facility] Emergency Department Provider Note  ____________________________________________   Event Date/Time   First MD Initiated Contact with Patient 05/25/21 1630     (approximate)  I have reviewed the triage vital signs and the nursing notes.   HISTORY  Chief Complaint Depression    HPI Maria Dickerson is a 40 y.o. female with depression who comes in with concerns for depression and SI.  Patient reports drinking some Windex and attempt to hurt herself last week..  Patient states that she drank only a small amount but that her SI is severe, constant, nothing makes it better or worse.  Denies any cough or other symptoms.  She does report having COVID 2 months ago.          Past Medical History:  Diagnosis Date   Depression    Skin cancer     Patient Active Problem List   Diagnosis Date Noted   Dysthymia 06/05/2016   Borderline personality disorder ([place]) 06/05/2016    Past Surgical History:  Procedure Laterality Date   skin cancer removal      Prior to Admission medications   Medication Sig Start Date End Date Taking? Authorizing Provider  ARIPiprazole (ABILIFY) 10 MG tablet Take 1 tablet (10 mg total) by mouth daily. 04/18/17   Schaevitz, Randall An, MD  colchicine 0.6 MG tablet Take 1 tablet (0.6 mg total) by mouth daily. 03/15/17   Hinda Kehr, MD  lidocaine (XYLOCAINE) 2 % solution Use as directed 5 mLs in the mouth or throat every 6 (six) hours as needed for mouth pain. Question swallow as needed for sore throat. 01/29/19   Sable Feil, PA-C    Allergies Patient has no known allergies.  History reviewed. No pertinent family history.  Social History Social History   Tobacco Use   Smoking status: Some Days    Types: Cigarettes   Smokeless tobacco: Never  Substance Use Topics   Alcohol use: No   Drug use: No      Review of Systems Constitutional: No fever/chills Eyes: No visual changes. ENT: No sore  throat. Cardiovascular: Denies chest pain. Respiratory: Denies shortness of breath. Gastrointestinal: No abdominal pain.  No nausea, no vomiting.  No diarrhea.  No constipation. Genitourinary: Negative for dysuria. Musculoskeletal: Negative for back pain. Skin: Negative for rash. Neurological: Negative for headaches, focal weakness or numbness. Psych: Depression All other ROS negative ____________________________________________   PHYSICAL EXAM:  VITAL SIGNS: ED Triage Vitals [05/25/21 1616]  Enc Vitals Group     BP (!) 138/99     Pulse Rate (!) 130     Resp 18     Temp 99.1 F (37.3 C)     Temp Source Oral     SpO2 95 %     Weight      Height 5\' 2"  (1.575 m)     Head Circumference      Peak Flow      Pain Score 0     Pain Loc      Pain Edu?      Excl. in [place]?     Constitutional: Alert and oriented. Well appearing and in no acute distress. Eyes: Conjunctivae are normal. No swelling around eyes Head: Atraumatic. Nose: No congestion/rhinnorhea. Mouth/Throat: Mucous membranes are moist.   Neck: No stridor. Trachea Midline. FROM Cardiovascular: Normal rate, no swelling noted Respiratory: No increased wob, no stridor Gastrointestinal: Soft and nontender. No distention. No abdominal bruits.  Musculoskeletal: No lower extremity tenderness nor edema.  No joint effusions. Neurologic:  Normal speech and language. No gross focal neurologic deficits are appreciated.  Skin:  Skin is warm, dry and intact. No rash noted. Psychiatric: Patient with SI, depression GU: Deferred   ____________________________________________   LABS (all labs ordered are listed, but only abnormal results are displayed)  Labs Reviewed  CBC - Abnormal; Notable for the following components:      Result Value   WBC 12.2 (*)    All other components within normal limits  COMPREHENSIVE METABOLIC PANEL  ETHANOL  SALICYLATE LEVEL  ACETAMINOPHEN LEVEL  URINE DRUG SCREEN, QUALITATIVE ([facility] ONLY)  POC  URINE PREG, ED   ____________________________________________   INITIAL IMPRESSION / ASSESSMENT AND PLAN / ED COURSE  Maria Dickerson was evaluated in Emergency Department on 05/25/2021 for the symptoms described in the history of present illness. She was evaluated in the context of the global COVID-19 pandemic, which necessitated consideration that the patient might be at risk for infection with the SARS-CoV-2 virus that causes COVID-19. Institutional protocols and algorithms that pertain to the evaluation of patients at risk for COVID-19 are in a state of rapid change based on information released by regulatory bodies including the CDC and federal and state organizations. These policies and algorithms were followed during the patient's care in the ED.    Pt is without any acute medical complaints.  Patient really little tachycardic with suspect this is just from some depression.  We will get a recheck.  No exam findings to suggest medical cause of current presentation. Will order psychiatric screening labs and discuss further w/ psychiatric service.  D/d includes but is not limited to psychiatric disease, behavioral/personality disorder, inadequate socioeconomic support, medical.  Based on HPI, exam, unremarkable labs, no concern for acute medical problem at this time. No rigidity, clonus, hyperthermia, focal neurologic deficit, diaphoresis, tachycardia, meningismus, ataxia, gait abnormality or other finding to suggest this visit represents a non-psychiatric problem. Screening labs reviewed.    Given this, pt medically cleared, to be dispositioned per Psych.    The patient has been placed in psychiatric observation due to the need to provide a safe environment for the patient while obtaining psychiatric consultation and evaluation, as well as ongoing medical and medication management to treat the patient's condition.  The patient has been placed under full IVC at this time.   I did get an EKG  because her heart rate was still little bit elevated and it was sinus tachycardia rate of 113, no ST elevation or T wave inversions, normal intervals.       ____________________________________________   FINAL CLINICAL IMPRESSION(S) / ED DIAGNOSES   Final diagnoses:  Suicidal ideation      MEDICATIONS GIVEN DURING THIS VISIT:  Medications - No data to display   ED Discharge Orders     None        Note:  This document was prepared using Dragon voice recognition software and may include unintentional dictation errors.    Vanessa [place], MD 05/25/21 1715    Vanessa , MD 05/25/21 [phone]

## 2021-05-25 NOTE — ED Triage Notes (Signed)
Pt comes pov with sadness, depression. Pt states "I just don't know who I am anymore. I've been putting things off on my family. I came to get it out." She's concerned that her family doesn't get it. Pt has a psychiatrist and is on medications for same. Has been taking them regularly. Pt does state that she wants to hurt herself. Pt states a week ago she drank "some" windex in an attempt to hurt herself. Has never tried this before.

## 2021-05-25 NOTE — ED Notes (Signed)
Pt belongings include a purse with wallet, bra, blue sweatshirt, two blue shoes, two socks, one pair jeans, one pair underwear, one striped shirt, one yellow necklace, one hair clip. Pt does NOT have cell phone.

## 2021-05-25 NOTE — BH Assessment (Signed)
Comprehensive Clinical Assessment (CCA) Note  05/25/2021 Roanne Simi [id number] Recommendations for Services/Supports/Treatments: Pending psych consult  Mennie Hoctor is a 40 year old, English speaking, white female with a hx of borderline personality disorder and depression. Pt presented to [facility] ED voluntarily due worsening depression. Pt reported that she'd ingested windex last week after getting upset with her sister. Pt explained that her main stressors stem from family conflict. Pt reported that she and her family fled from war in [place] and that she has been in the states 30 years. The patient explained that she is isolative and has no friends. Pt reported that she is not employed. Pt reported feeling lost and alone. The pt had both normal insight and judgement. Pt did not appear to be responding to internal or external stimuli. Pt admitted to prior hospitalizations. Pt was oriented x4 and had a pleasant demeanor. Pt had normal speech and unremarkable psychomotor activity. Pt presented with an appropriate mood; affect was anxious. Pt denied current SI/HI/AV/H.  Chief Complaint:  Chief Complaint  Patient presents with   Depression   Visit Diagnosis: MDD, recurrent, severe    CCA Screening, Triage and Referral (STR)  Patient Reported Information How did you hear about [place]? Self  Referral name: No data recorded Referral phone number: No data recorded  Whom do you see for routine medical problems? No data recorded Practice/Facility Name: No data recorded Practice/Facility Phone Number: No data recorded Name of Contact: No data recorded Contact Number: No data recorded Contact Fax Number: No data recorded Prescriber Name: No data recorded Prescriber Address (if known): No data recorded  What Is the Reason for Your Visit/Call Today? Worsening depression and SI.  How Long Has This Been Causing You Problems? 1 wk - 1 month  What Do You Feel Would Help You the Most Today?  Treatment for Depression or other mood problem   Have You Recently Been in Any Inpatient Treatment (Hospital/Detox/Crisis Center/28-Day Program)? No data recorded Name/Location of Program/Hospital:No data recorded How Long Were You There? No data recorded When Were You Discharged? No data recorded  Have You Ever Received Services From [facility] Before? No data recorded Who Do You See at [facility]? No data recorded  Have You Recently Had Any Thoughts About Hurting Yourself? Yes  Are You Planning to Commit Suicide/Harm Yourself At This time? No   Have you Recently Had Thoughts About [place]? No  Explanation: No data recorded  Have You Used Any Alcohol or Drugs in the Past 24 Hours? No  How Long Ago Did You Use Drugs or Alcohol? No data recorded What Did You Use and How Much? No data recorded  Do You Currently Have a Therapist/Psychiatrist? Yes  Name of Therapist/Psychiatrist: [facility]   Have You Been Recently Discharged From Any Office Practice or Programs? No  Explanation of Discharge From Practice/Program: No data recorded    CCA Screening Triage Referral Assessment Type of Contact: Face-to-Face  Is this Initial or Reassessment? No data recorded Date Telepsych consult ordered in [facility]:  No data recorded Time Telepsych consult ordered in [facility]:  No data recorded  Patient Reported Information Reviewed? No data recorded Patient Left Without Being Seen? No data recorded Reason for Not Completing Assessment: No data recorded  Collateral Involvement: None provided   Does Patient Have a [place]? No data recorded Name and Contact of Legal Guardian: No data recorded If Minor and Not Living with Parent(s), Who has Custody? No data recorded Is CPS involved or  ever been involved? Never  Is APS involved or ever been involved? Never   Patient Determined To Be At Risk for Harm To Self or Others Based on Review of Patient Reported Information or  Presenting Complaint? Yes, for Self-Harm  Method: No data recorded Availability of Means: No data recorded Intent: No data recorded Notification Required: No data recorded Additional Information for Danger to Others Potential: No data recorded Additional Comments for Danger to Others Potential: No data recorded Are There Guns or Other Weapons in Your Home? No data recorded Types of Guns/Weapons: No data recorded Are These Weapons Safely Secured?                            No data recorded Who Could Verify You Are Able To Have These Secured: No data recorded Do You Have any Outstanding Charges, Pending Court Dates, Parole/Probation? No data recorded Contacted To Inform of Risk of Harm To Self or Others: No data recorded  Location of Assessment: [facility]   Does Patient Present under Involuntary Commitment? Yes  IVC Papers Initial File Date: 05/25/21   [place] of Residence: [place]   Patient Currently Receiving the Following Services: Medication Management   Determination of Need: Emergent (2 hours)   Options For Referral: Therapeutic Triage Services     CCA Biopsychosocial Intake/Chief Complaint:  No data recorded Current Symptoms/Problems: No data recorded  Patient Reported Schizophrenia/Schizoaffective Diagnosis in Past: No   Strengths: Pt is able to ask for help.  Preferences: No data recorded Abilities: No data recorded  Type of Services Patient Feels are Needed: No data recorded  Initial Clinical Notes/Concerns: No data recorded  Mental Health Symptoms Depression:   Increase/decrease in appetite; Tearfulness; Worthlessness   Duration of Depressive symptoms:  Greater than two weeks   Mania:   None   Anxiety:    Worrying; Irritability   Psychosis:   None   Duration of Psychotic symptoms: No data recorded  Trauma:   N/A   Obsessions:   None   Compulsions:  No data recorded  Inattention:   None   Hyperactivity/Impulsivity:   None    Oppositional/Defiant Behaviors:   None   Emotional Irregularity:   Intense/unstable relationships; Mood lability; Recurrent suicidal behaviors/gestures/threats   Other Mood/Personality Symptoms:  No data recorded   Mental Status Exam Appearance and self-care  Stature:   Average   Weight:   Overweight   Clothing:   -- (In scrubs)   Grooming:   Normal   Cosmetic use:   None   Posture/gait:   Normal   Motor activity:   Not Remarkable   Sensorium  Attention:   Normal   Concentration:   Normal   Orientation:   Place; Situation; Person; Object   Recall/memory:   Normal   Affect and Mood  Affect:   Appropriate   Mood:   Euthymic   Relating  Eye contact:   Normal   Facial expression:   Responsive   Attitude toward examiner:   Cooperative   Thought and Language  Speech flow:  Clear and Coherent   Thought content:   Appropriate to Mood and Circumstances   Preoccupation:   None   Hallucinations:   None   Organization:  No data recorded  [employer] of Knowledge:   Average   Intelligence:   Average   Abstraction:   Normal   Judgement:   Normal   Reality Testing:  Adequate   Insight:   Present   Decision Making:   Normal   Social Functioning  Social Maturity:   Isolates   Social Judgement:   Impropriety   Stress  Stressors:   Family conflict   Coping Ability:   Exhausted   Skill Deficits:   None   Supports:   Friends/Service system; Support needed     Religion: Religion/Spirituality Are You A Religious Person?: No  Leisure/Recreation: Leisure / Recreation Do You Have Hobbies?: No  Exercise/Diet: Exercise/Diet Do You Exercise?: No Have You Gained or Lost A Significant Amount of Weight in the Past Six Months?: No Do You Follow a Special Diet?: No Do You Have Any Trouble Sleeping?: No   CCA Employment/Education Employment/Work Situation: Employment / Work Situation Employment  Situation: Unemployed Patient's Job has Been Impacted by Current Illness: No Has Patient ever Been in Passenger transport manager?: No  Education: Education Is Patient Currently Attending School?: No Did Physicist, medical?: No Did You Have An Individualized Education Program (IIEP): No Did You Have Any Difficulty At [employer]?: No Patient's Education Has Been Impacted by Current Illness: No   CCA Family/Childhood History Family and Relationship History: Family history Marital status: Single Does patient have children?: No  Childhood History:  Childhood History By whom was/is the patient raised?: Both parents Did patient suffer any verbal/emotional/physical/sexual abuse as a child?: No Did patient suffer from severe childhood neglect?: No Has patient ever been sexually abused/assaulted/raped as an adolescent or adult?: No Was the patient ever a victim of a crime or a disaster?: No Witnessed domestic violence?: No Has patient been affected by domestic violence as an adult?: No  Child/Adolescent Assessment:     CCA Substance Use Alcohol/Drug Use: Alcohol / Drug Use Pain Medications: See PTA Prescriptions: See PTA Over the Counter: See PTA History of alcohol / drug use?: No history of alcohol / drug abuse Longest period of sobriety (when/how long): Unknown                         ASAM's:  Six Dimensions of Multidimensional Assessment  Dimension 1:  Acute Intoxication and/or Withdrawal Potential:      Dimension 2:  Biomedical Conditions and Complications:      Dimension 3:  Emotional, Behavioral, or Cognitive Conditions and Complications:     Dimension 4:  Readiness to Change:     Dimension 5:  Relapse, Continued use, or Continued Problem Potential:     Dimension 6:  Recovery/Living Environment:     ASAM Severity Score:    ASAM Recommended Level of Treatment:     Substance use Disorder (SUD)    Recommendations for Services/Supports/Treatments:    DSM5  Diagnoses: Patient Active Problem List   Diagnosis Date Noted   Dysthymia 06/05/2016   Borderline personality disorder ([place]) 06/05/2016   Suki Crockett R Holt, LCAS

## 2021-05-26 DIAGNOSIS — F314 Bipolar disorder, current episode depressed, severe, without psychotic features: Secondary | ICD-10-CM

## 2021-05-26 DIAGNOSIS — R45851 Suicidal ideations: Secondary | ICD-10-CM | POA: Diagnosis not present

## 2021-05-26 MED ORDER — TRAZODONE HCL 100 MG PO TABS: 50.0000 mg | ORAL_TABLET | Freq: Every evening | ORAL | Status: DC | PRN

## 2021-05-26 NOTE — ED Notes (Signed)
Patient received dinner tray 

## 2021-05-26 NOTE — ED Notes (Signed)
Pt alert, ambulated to the bathroom without difficulty at this time.

## 2021-05-26 NOTE — ED Notes (Signed)
Pt calm and cooperative. States that she feels a lot better than yesterday. States that she does not feel like she wants to hurt herself "as much" as yesterday.

## 2021-05-26 NOTE — ED Notes (Signed)
Pt given meal tray.

## 2021-05-26 NOTE — Consult Note (Signed)
[facility] Face-to-Face Psychiatry Consult   Reason for Consult:  Suicidal Ideation Referring Physician:  EDP Patient Identification: Maria Dickerson MRN:  [id number] Principal Diagnosis: Bipolar affective disorder, depressed, severe ([place]) Diagnosis:  Principal Problem:   Bipolar affective disorder, depressed, severe ([place])   Total Time spent with patient: 1 hour  Subjective:   Maria Dickerson is a 40 y.o. female patient admitted with "my sister and I keep fighting and I drank Windex."  Evidently, she taunted her and she did.  HPI:  Maria Dickerson is a 40 year old female self-presented due to suicidal ideation and self-destructive behavior such as drinking Windex during arguments with family. The patient states "only drinking a little Windex during a fight with my sister to scare her," but also feeling "so sad I had to drink Windex." She currently denies suicidal and homicidal ideation, delusions or hallucinations, but does state an inability to sleep and persistent arguments with her sister that are "making [her] really depressed." The patient states having been very depressed last night with plans to harm herself. Denies access to firearms.  On evaluation the patient is mildly distractible and tangential, explaining that she would "never hurt a fly, actually, if there is a fly I like to catch them and make sure they are safe and let them outside." The patient does state feeling safe at home, but says "sometimes I just get so depressed, and that's how I felt last night." Based on the client's actions and level of depression, inpatient warranted.   Past Psychiatric History: Depression  Risk to Self:  Yes Risk to Others:  No Prior Inpatient Therapy:  No Prior Outpatient Therapy:  Yes  Past Medical History:  Past Medical History:  Diagnosis Date   Depression    Skin cancer     Past Surgical History:  Procedure Laterality Date   skin cancer removal     Family History: History reviewed.  No pertinent family history. Family Psychiatric  History: History reviewed. No pertinent family psychiatric history on file. Social History:  Social History   Substance and Sexual Activity  Alcohol Use No     Social History   Substance and Sexual Activity  Drug Use No    Social History   Socioeconomic History   Marital status: Single    Spouse name: Not on file   Number of children: Not on file   Years of education: Not on file   Highest education level: Not on file  Occupational History   Not on file  Tobacco Use   Smoking status: Some Days    Types: Cigarettes   Smokeless tobacco: Never  Substance and Sexual Activity   Alcohol use: No   Drug use: No   Sexual activity: Not on file  Other Topics Concern   Not on file  Social History Narrative   Not on file   Social Determinants of Health   Financial Resource Strain: Not on file  Food Insecurity: Not on file  Transportation Needs: Not on file  Physical Activity: Not on file  Stress: Not on file  Social Connections: Not on file   Additional Social History:    Allergies:  No Known Allergies  Labs:  Results for orders placed or performed during the hospital encounter of 05/25/21 (from the past 48 hour(s))  Comprehensive metabolic panel     Status: Abnormal   Collection Time: 05/25/21  4:18 PM  Result Value Ref Range   Sodium 134 (L) 135 - 145 mmol/L   Potassium 3.8  3.5 - 5.1 mmol/L   Chloride 103 98 - 111 mmol/L   CO2 22 22 - 32 mmol/L   Glucose, Bld 190 (H) 70 - 99 mg/dL    Comment: Glucose reference range applies only to samples taken after fasting for at least 8 hours.   BUN 6 6 - 20 mg/dL   Creatinine, Ser 0.74 0.44 - 1.00 mg/dL   Calcium 9.2 8.9 - 10.3 mg/dL   Total Protein 8.5 (H) 6.5 - 8.1 g/dL   Albumin 5.0 3.5 - 5.0 g/dL   AST 32 15 - 41 U/L   ALT 28 0 - 44 U/L   Alkaline Phosphatase 71 38 - 126 U/L   Total Bilirubin 0.4 0.3 - 1.2 mg/dL   GFR, Estimated >60 >60 mL/min    Comment:  (NOTE) Calculated using the CKD-EPI Creatinine Equation (2021)    Anion gap 9 5 - 15    Comment: Performed at [facility], [address] [place], [place] [address]  Ethanol     Status: None   Collection Time: 05/25/21  4:18 PM  Result Value Ref Range   Alcohol, Ethyl (B) <10 <10 mg/dL    Comment: (NOTE) Lowest detectable limit for serum alcohol is 10 mg/dL.  For medical purposes only. Performed at [facility], [address] [place], [place] [address]   Salicylate level     Status: Abnormal   Collection Time: 05/25/21  4:18 PM  Result Value Ref Range   Salicylate Lvl <5.9 (L) 7.0 - 30.0 mg/dL    Comment: Performed at [facility], [address] [place], [place] [address]  Acetaminophen level     Status: Abnormal   Collection Time: 05/25/21  4:18 PM  Result Value Ref Range   Acetaminophen (Tylenol), Serum <10 (L) 10 - 30 ug/mL    Comment: (NOTE) Therapeutic concentrations vary significantly. A range of 10-30 ug/mL  may be an effective concentration for many patients. However, some  are best treated at concentrations outside of this range. Acetaminophen concentrations >150 ug/mL at 4 hours after ingestion  and >50 ug/mL at 12 hours after ingestion are often associated with  toxic reactions.  Performed at [facility], [address] [place], [place] [address]   cbc     Status: Abnormal   Collection Time: 05/25/21  4:18 PM  Result Value Ref Range   WBC 12.2 (H) 4.0 - 10.5 K/uL   RBC 4.45 3.87 - 5.11 MIL/uL   Hemoglobin 13.9 12.0 - 15.0 g/dL   HCT 38.7 36.0 - 46.0 %   MCV 87.0 80.0 - 100.0 fL   MCH 31.2 26.0 - 34.0 pg   MCHC 35.9 30.0 - 36.0 g/dL   RDW 12.2 11.5 - 15.5 %   Platelets 298 150 - 400 K/uL   nRBC 0.0 0.0 - 0.2 %    Comment: Performed at [facility], [address], [place],  [address]  Urine Drug Screen, Qualitative     Status: None   Collection Time: 05/25/21  4:18 PM  Result Value Ref Range    Tricyclic, Ur Screen NONE DETECTED NONE DETECTED   Amphetamines, Ur Screen NONE DETECTED NONE DETECTED   MDMA (Ecstasy)Ur Screen NONE DETECTED NONE DETECTED   Cocaine Metabolite,Ur [place] NONE DETECTED NONE DETECTED   Opiate, Ur Screen NONE DETECTED NONE DETECTED   Phencyclidine (PCP) Ur S NONE DETECTED NONE DETECTED   Cannabinoid 50 Ng, Ur [place] NONE DETECTED NONE DETECTED   Barbiturates, Ur  Screen NONE DETECTED NONE DETECTED   Benzodiazepine, Ur Scrn NONE DETECTED NONE DETECTED   Methadone Scn, Ur NONE DETECTED NONE DETECTED    Comment: (NOTE) Tricyclics + metabolites, urine    Cutoff 1000 ng/mL Amphetamines + metabolites, urine  Cutoff 1000 ng/mL MDMA (Ecstasy), urine              Cutoff 500 ng/mL Cocaine Metabolite, urine          Cutoff 300 ng/mL Opiate + metabolites, urine        Cutoff 300 ng/mL Phencyclidine (PCP), urine         Cutoff 25 ng/mL Cannabinoid, urine                 Cutoff 50 ng/mL Barbiturates + metabolites, urine  Cutoff 200 ng/mL Benzodiazepine, urine              Cutoff 200 ng/mL Methadone, urine                   Cutoff 300 ng/mL  The urine drug screen provides only a preliminary, unconfirmed analytical test result and should not be used for non-medical purposes. Clinical consideration and professional judgment should be applied to any positive drug screen result due to possible interfering substances. A more specific alternate chemical method must be used in order to obtain a confirmed analytical result. Gas chromatography / mass spectrometry (GC/MS) is the preferred confirm atory method. Performed at [facility], [address] [place], [place] [address]   Pregnancy, urine     Status: None   Collection Time: 05/25/21  4:18 PM  Result Value Ref Range   Preg Test, Ur NEGATIVE NEGATIVE    Comment: Performed at [facility], [address] [place], [place] [address]  Resp Panel by RT-PCR (Flu A&B, Covid) Nasopharyngeal Swab     Status:  None   Collection Time: 05/25/21  6:11 PM   Specimen: Nasopharyngeal Swab; Nasopharyngeal(NP) swabs in vial transport medium  Result Value Ref Range   SARS Coronavirus 2 by RT PCR NEGATIVE NEGATIVE    Comment: (NOTE) SARS-CoV-2 target nucleic acids are NOT DETECTED.  The SARS-CoV-2 RNA is generally detectable in upper respiratory specimens during the acute phase of infection. The lowest concentration of SARS-CoV-2 viral copies this assay can detect is 138 copies/mL. A negative result does not preclude SARS-Cov-2 infection and should not be used as the sole basis for treatment or other patient management decisions. A negative result may occur with  improper specimen collection/handling, submission of specimen other than nasopharyngeal swab, presence of viral mutation(s) within the areas targeted by this assay, and inadequate number of viral copies(<138 copies/mL). A negative result must be combined with clinical observations, patient history, and epidemiological information. The expected result is Negative.  Fact Sheet for Patients:  EntrepreneurPulse.com.au  Fact Sheet for Healthcare Providers:  IncredibleEmployment.be  This test is no t yet approved or cleared by the [place] FDA and  has been authorized for detection and/or diagnosis of SARS-CoV-2 by FDA under an Emergency Use Authorization (EUA). This EUA will remain  in effect (meaning this test can be used) for the duration of the COVID-19 declaration under Section 564(b)(1) of the Act, 21 U.S.C.section 360bbb-3(b)(1), unless the authorization is terminated  or revoked sooner.       Influenza A by PCR NEGATIVE NEGATIVE   Influenza B by PCR NEGATIVE NEGATIVE    Comment: (NOTE) The Xpert Xpress SARS-CoV-2/FLU/RSV plus assay is intended as an aid  in the diagnosis of influenza from Nasopharyngeal swab specimens and should not be used as a sole basis for treatment. Nasal washings  and aspirates are unacceptable for Xpert Xpress SARS-CoV-2/FLU/RSV testing.  Fact Sheet for Patients: EntrepreneurPulse.com.au  Fact Sheet for Healthcare Providers: IncredibleEmployment.be  This test is not yet approved or cleared by the [place] FDA and has been authorized for detection and/or diagnosis of SARS-CoV-2 by FDA under an Emergency Use Authorization (EUA). This EUA will remain in effect (meaning this test can be used) for the duration of the COVID-19 declaration under Section 564(b)(1) of the Act, 21 U.S.C. section 360bbb-3(b)(1), unless the authorization is terminated or revoked.  Performed at [facility], [address] [place], [place] [address]     Current Facility-Administered Medications  Medication Dose Route Frequency Provider Last Rate Last Admin   citalopram (CELEXA) tablet 20 mg  20 mg Oral q morning Vanessa Godley, MD   20 mg at 05/26/21 1007   OLANZapine (ZYPREXA) tablet 15 mg  15 mg Oral QHS Vanessa Hollenberg, MD   15 mg at 05/25/21 2146   Oxcarbazepine (TRILEPTAL) tablet 300 mg  300 mg Oral BID Vanessa Beclabito, MD   300 mg at 05/26/21 1007   Current Outpatient Medications  Medication Sig Dispense Refill   citalopram (CELEXA) 20 MG tablet Take 20 mg by mouth every morning.     OLANZapine (ZYPREXA) 15 MG tablet Take 15 mg by mouth at bedtime.     Oxcarbazepine (TRILEPTAL) 300 MG tablet Take 300 mg by mouth 2 (two) times daily.      Musculoskeletal: Strength & Muscle Tone: within normal limits Gait & Station: normal Patient leans: Right  Psychiatric Specialty Exam: Physical Exam Vitals and nursing note reviewed.  Constitutional:      Appearance: Normal appearance.  HENT:     Head: Normocephalic.     Nose: Nose normal.  Pulmonary:     Effort: Pulmonary effort is normal.  Musculoskeletal:        General: Normal range of motion.     Cervical back: Normal range of motion.  Neurological:      General: No focal deficit present.     Mental Status: She is alert and oriented to person, place, and time.  Psychiatric:        Attention and Perception: Attention and perception normal.        Mood and Affect: Mood is anxious and depressed.        Speech: Speech normal.        Behavior: Behavior normal. Behavior is cooperative.        Thought Content: Thought content includes suicidal ideation. Thought content includes suicidal plan.        Cognition and Memory: Cognition and memory normal.        Judgment: Judgment is impulsive.    Review of Systems  Psychiatric/Behavioral:  Positive for dysphoric mood. The patient is nervous/anxious.   All other systems reviewed and are negative.  Blood pressure (!) 135/91, pulse 93, temperature 98.6 F (37 C), temperature source Oral, resp. rate 18, height 5\' 2"  (1.575 m), SpO2 96 %.Body mass index is 29.26 kg/m.  General Appearance: Disheveled  Eye Contact:  Fair  Speech:  Clear and Coherent  Volume:  Normal  Mood:  Depressed  Affect:  Congruent  Thought Process:  Coherent  Orientation:  Full (Time, Place, and Person)  Thought Content:   intermittent rumination  Suicidal Thoughts:  Yes.  without intent/plan  Homicidal Thoughts:  No  Memory:  Immediate;   Good Recent;   Good Remote;   Good  Judgement:  Fair  Insight:  Fair  Psychomotor Activity:  Normal  Concentration:  Concentration: Good and Attention Span: Good  Recall:  Good  Fund of Knowledge:  Good  Language:  Good  Akathisia:  NA  Handed:  Right  AIMS (if indicated):     Assets:  Housing Social Support  ADL's:  Intact  Cognition:  WNL  Sleep:        Physical Exam: Physical Exam Vitals and nursing note reviewed.  Constitutional:      Appearance: Normal appearance.  HENT:     Head: Normocephalic.     Nose: Nose normal.  Pulmonary:     Effort: Pulmonary effort is normal.  Musculoskeletal:        General: Normal range of motion.     Cervical back: Normal range of  motion.  Neurological:     General: No focal deficit present.     Mental Status: She is alert and oriented to person, place, and time.  Psychiatric:        Attention and Perception: Attention and perception normal.        Mood and Affect: Mood is anxious and depressed.        Speech: Speech normal.        Behavior: Behavior normal. Behavior is cooperative.        Thought Content: Thought content includes suicidal ideation. Thought content includes suicidal plan.        Cognition and Memory: Cognition and memory normal.        Judgment: Judgment is impulsive.   Review of Systems  Psychiatric/Behavioral:  Positive for dysphoric mood. The patient is nervous/anxious.   All other systems reviewed and are negative. Blood pressure (!) 135/91, pulse 93, temperature 98.6 F (37 C), temperature source Oral, resp. rate 18, height 5\' 2"  (1.575 m), SpO2 96 %. Body mass index is 29.26 kg/m.  Treatment Plan Summary:  Admit to inpatient for stabilization due to moderate suicide risk in the setting of poor frustration tolerance and recent ingestion of Windex during an argument with her sibling.  Medications:  Bipolar affective disorder, depressed, severe:  Continue: Citalopram 20 mg q morning Continue: Oxcarbazepine 300 mg BID Continue: Olanzapine 15 mg QHS  Insomnia  Start Trazodone 50 mg PRN at bedtime as needed for sleep; repeat 50 mg in one hour if needed   Disposition: Recommend psychiatric Inpatient admission when medically cleared. Discussed crisis plan, support from social network, calling 911, coming to the Emergency Department, and calling Suicide Hotline.  Waylan Boga, NP 05/26/2021 5:01 PM

## 2021-05-26 NOTE — ED Notes (Signed)
Pt given snack. 

## 2021-05-26 NOTE — ED Notes (Signed)
Notified by TTS that pt has been accepted to [facility]. Pt notified of acceptance to [facility] tomorrow per tts.

## 2021-05-26 NOTE — BH Assessment (Addendum)
Referral information for Psychiatric Hospitalization faxed to:  [facility] ([phone]-or- [phone]),   [facility] ([phone]),  [facility] ([phone], [phone], [phone] or [phone]),   [facility] ([phone] or [phone])  [facility] ([phone]),   [facility] ([phone] -or- [phone]),   [facility] ([phone]).  [facility] ([phone])  

## 2021-05-26 NOTE — ED Notes (Signed)
Breakfast tray given. °

## 2021-05-26 NOTE — ED Provider Notes (Signed)
Emergency Medicine Observation Re-evaluation Note  Maria Dickerson is a 40 y.o. female, seen on rounds today.  Physical Exam  BP 108/73 (BP Location: Right Arm)   Pulse 89   Temp 98.6 F (37 C) (Oral)   Resp 18   Ht 5\' 2"  (1.575 m)   SpO2 93%   BMI 29.26 kg/m  Physical Exam General: Patient sleeping comfortably in bed Lungs: Patient in no respiratory distress Psych: Patient not combative  ED Course / MDM  EKG:     Plan  Current plan is for psychiatric treatment and disposition Maria Dickerson is under involuntary commitment.      Nena Polio, MD 05/26/21 [phone]

## 2021-05-26 NOTE — ED Notes (Signed)
Pt transferred into ED BHU  room 2   Patient assigned to appropriate care area. Patient oriented to unit/care area: Informed that, for her safety, care areas are designed for safety and monitored by security cameras at all times; Visiting hours and phone times explained to patient. Patient verbalizes understanding, and verbal contract for safety obtained.     She is IVC  psych consult pending

## 2021-05-26 NOTE — BH Assessment (Signed)
PATIENT BED AVAILABLE AFTER 9AM ON 05/27/21  Patient has been accepted to [facility].  Patient assigned to [facility] Accepting physician is Dr. Jonelle Sports.  Call report to [phone].  Representative was [place].   ER Staff is aware of it:  [facility] ER Secretary  Dr. Joni Fears, ER MD  April Patient's Nurse

## 2021-05-26 NOTE — ED Notes (Signed)
Snack was given

## 2021-05-27 DIAGNOSIS — R45851 Suicidal ideations: Secondary | ICD-10-CM | POA: Diagnosis not present

## 2021-05-27 NOTE — ED Notes (Addendum)
RN attempted to call report to [place].  No answer.

## 2021-05-27 NOTE — ED Notes (Signed)
Called [facility] for transport to [facility]

## 2021-05-27 NOTE — ED Notes (Signed)
Report to amy, rn

## 2021-05-27 NOTE — ED Notes (Signed)
Pt discharged under IVC to [facility]. Pt  cooperative. 1 belongings bag sent with officer.

## 2021-05-27 NOTE — ED Notes (Signed)
Meal tray given 

## 2021-05-27 NOTE — ED Notes (Signed)
Report called to Carrie, RN.

## 2021-06-21 ENCOUNTER — Other Ambulatory Visit: Payer: Self-pay

## 2021-06-24 ENCOUNTER — Other Ambulatory Visit: Payer: Self-pay

## 2021-06-24 MED ORDER — DIVALPROEX SODIUM 500 MG PO DR TAB
DELAYED_RELEASE_TABLET | ORAL | 2 refills | Status: DC
  Filled 2021-06-24: qty 60, 30d supply, fill #0

## 2021-06-24 MED ORDER — OLANZAPINE 20 MG PO TABS
ORAL_TABLET | ORAL | 2 refills | Status: DC
  Filled 2021-06-24: qty 30, 30d supply, fill #0

## 2021-07-01 ENCOUNTER — Other Ambulatory Visit: Payer: Self-pay

## 2021-07-02 ENCOUNTER — Other Ambulatory Visit: Payer: Self-pay

## 2021-07-11 ENCOUNTER — Other Ambulatory Visit: Payer: Self-pay

## 2021-07-12 ENCOUNTER — Other Ambulatory Visit: Payer: Self-pay

## 2021-09-13 ENCOUNTER — Other Ambulatory Visit: Payer: Self-pay

## 2021-09-13 MED ORDER — DIVALPROEX SODIUM 500 MG PO DR TAB: DELAYED_RELEASE_TABLET | ORAL | 2 refills | Status: DC

## 2021-09-13 MED ORDER — OLANZAPINE 20 MG PO TABS: ORAL_TABLET | ORAL | 2 refills | Status: DC

## 2021-10-06 ENCOUNTER — Emergency Department
Admission: EM | Admit: 2021-10-06 | Discharge: 2021-10-06 | Disposition: A | Discharge status: Home / Self Care | Payer: Medicare Other | Attending: Emergency Medicine | Admitting: Emergency Medicine

## 2021-10-06 ENCOUNTER — Other Ambulatory Visit: Payer: Self-pay

## 2021-10-06 DIAGNOSIS — S3992XA Unspecified injury of lower back, initial encounter: Secondary | ICD-10-CM | POA: Diagnosis present

## 2021-10-06 DIAGNOSIS — X58XXXA Exposure to other specified factors, initial encounter: Secondary | ICD-10-CM | POA: Diagnosis not present

## 2021-10-06 DIAGNOSIS — S39012A Strain of muscle, fascia and tendon of lower back, initial encounter: Secondary | ICD-10-CM | POA: Diagnosis not present

## 2021-10-06 MED ORDER — OXYCODONE-ACETAMINOPHEN 7.5-325 MG PO TABS: 1.0000 | ORAL_TABLET | Freq: Four times a day (QID) | ORAL | 0 refills | Status: DC | PRN

## 2021-10-06 MED ORDER — ORPHENADRINE CITRATE ER 100 MG PO TB12: 100.0000 mg | ORAL_TABLET | Freq: Two times a day (BID) | ORAL | 0 refills | Status: DC

## 2021-10-06 NOTE — Discharge Instructions (Signed)
And follow discharge care instruction.  Take medication as directed.  Advised to follow-up with family doctor for continued care. ?

## 2021-10-06 NOTE — ED Triage Notes (Signed)
Pt states lower back pain for several days. Pt states she feels her back crunching. Pt denies hematuria, dysuria, fever, pt ambulatory without difficulty.  ?

## 2021-10-06 NOTE — ED Provider Notes (Signed)
? ?  [facility] ?Provider Note ? ? ? None  ?  (approximate) ? ? ?History  ? ?Back Pain ? ? ?HPI ? ?Maria Dickerson is a 41 y.o. female patient presents with low back pain for several days.  Patient denies  provocative incident for complaint.  Patient denies radicular component to back pain.  Patient denies bladder or bowel dysfunction.  Patient denies hematuria or dysuria. ?  ? ? ?Physical Exam  ? ?Triage Vital Signs: ?ED Triage Vitals  ?Enc Vitals Group  ?   BP 10/06/21 1900 (!) 142/97  ?   Pulse Rate 10/06/21 1900 100  ?   Resp 10/06/21 1900 16  ?   Temp 10/06/21 1900 98.5 ?F (36.9 ?C)  ?   Temp src --   ?   SpO2 10/06/21 1900 100 %  ?   Weight 10/06/21 1901 170 lb (77.1 kg)  ?   Height 10/06/21 1901 '5\' 2"'$  (1.575 m)  ?   Head Circumference --   ?   Peak Flow --   ?   Pain Score 10/06/21 1900 10  ?   Pain Loc --   ?   Pain Edu? --   ?   Excl. in [place]? --   ? ? ?Most recent vital signs: ?Vitals:  ? 10/06/21 1900  ?BP: (!) 142/97  ?Pulse: 100  ?Resp: 16  ?Temp: 98.5 ?F (36.9 ?C)  ?SpO2: 100%  ? ? ? ?General: Awake, no distress.  ?CV:  Good peripheral perfusion.  ?Resp:  Normal effort.  ?Abd:  No distention.  ?Other:  Low back pain. ? ? ?ED Results / Procedures / Treatments  ? ?Labs ?(all labs ordered are listed, but only abnormal results are displayed) ?Labs Reviewed - No data to display ? ? ?EKG ? ? ? ? ?RADIOLOGY ? ?PROCEDURES: ? ?Critical Care performed: No ? ?Procedures ? ? ?MEDICATIONS ORDERED IN ED: ?Medications - No data to display ? ? ?IMPRESSION / MDM / ASSESSMENT AND PLAN / ED COURSE  ?I reviewed the triage vital signs and the nursing notes. ?             ?               ? ?Differential diagnosis includes, but is not limited to, lumbar strain versus degenerative Disc disease.  Patient complaining physical exam consistent with lumbar strain.  Patient given prescription for Norflex and Percocets.  Patient advised to follow-up with PCP for definitive evaluation and treatment.  Advised  patient since she does not have an escort cannot give pain medication or muscle relaxers due to her driving. ? ? ?FINAL CLINICAL IMPRESSION(S) / ED DIAGNOSES  ? ?Final diagnoses:  ?Strain of lumbar region, initial encounter  ? ? ? ?Rx / DC Orders  ? ?ED Discharge Orders   ? ?      Ordered  ?  orphenadrine (NORFLEX) 100 MG tablet  2 times daily       ? 10/06/21 2032  ?  oxyCODONE-acetaminophen (PERCOCET) 7.5-325 MG tablet  Every 6 hours PRN       ? 10/06/21 2032  ? ?  ?  ? ?  ? ? ? ?Note:  This document was prepared using Dragon voice recognition software and may include unintentional dictation errors.  ?  ?Sable Feil, PA-C ?10/06/21 2037 ? ?  ?Delman Kitten, MD ?10/06/21 2228 ? ?

## 2021-10-10 ENCOUNTER — Other Ambulatory Visit: Payer: Self-pay

## 2021-10-14 ENCOUNTER — Other Ambulatory Visit: Payer: Self-pay

## 2021-11-28 ENCOUNTER — Other Ambulatory Visit: Payer: Self-pay

## 2021-11-28 MED ORDER — DIVALPROEX SODIUM 500 MG PO DR TAB
DELAYED_RELEASE_TABLET | Freq: Two times a day (BID) | ORAL | 0 refills | Status: DC
  Filled 2021-11-28: qty 60, 30d supply, fill #0

## 2021-11-28 MED ORDER — OLANZAPINE 20 MG PO TABS: ORAL_TABLET | Freq: Every evening | ORAL | 0 refills | Status: DC

## 2023-03-14 ENCOUNTER — Emergency Department: Payer: Medicare Other

## 2023-03-14 ENCOUNTER — Other Ambulatory Visit: Payer: Self-pay

## 2023-03-14 ENCOUNTER — Emergency Department
Admission: EM | Admit: 2023-03-14 | Discharge: 2023-03-14 | Disposition: A | Discharge status: Home / Self Care | Payer: Medicare Other | Attending: Emergency Medicine | Admitting: Emergency Medicine

## 2023-03-14 DIAGNOSIS — N6459 Other signs and symptoms in breast: Secondary | ICD-10-CM | POA: Insufficient documentation

## 2023-03-14 DIAGNOSIS — L988 Other specified disorders of the skin and subcutaneous tissue: Secondary | ICD-10-CM

## 2023-03-14 LAB — LIPASE, BLOOD: Lipase: 34 U/L (ref 11–51)

## 2023-03-14 LAB — CBC
HCT: 41.3 % (ref 36.0–46.0)
Hemoglobin: 14.2 g/dL (ref 12.0–15.0)
MCH: 30 pg (ref 26.0–34.0)
MCHC: 34.4 g/dL (ref 30.0–36.0)
MCV: 87.3 fL (ref 80.0–100.0)
Platelets: 273 10*3/uL (ref 150–400)
RBC: 4.73 MIL/uL (ref 3.87–5.11)
RDW: 11.9 % (ref 11.5–15.5)
WBC: 9.5 10*3/uL (ref 4.0–10.5)
nRBC: 0 % (ref 0.0–0.2)

## 2023-03-14 LAB — COMPREHENSIVE METABOLIC PANEL
ALT: 32 U/L (ref 0–44)
AST: 26 U/L (ref 15–41)
Albumin: 4.8 g/dL (ref 3.5–5.0)
Alkaline Phosphatase: 40 U/L (ref 38–126)
Anion gap: 10 (ref 5–15)
BUN: 8 mg/dL (ref 6–20)
CO2: 21 mmol/L — ABNORMAL LOW (ref 22–32)
Calcium: 8.9 mg/dL (ref 8.9–10.3)
Chloride: 105 mmol/L (ref 98–111)
Creatinine, Ser: 0.76 mg/dL (ref 0.44–1.00)
GFR, Estimated: >60 mL/min (ref >60)
Glucose, Bld: 147 mg/dL — ABNORMAL HIGH (ref 70–99)
Potassium: 4 mmol/L (ref 3.5–5.1)
Sodium: 136 mmol/L (ref 135–145)
Total Bilirubin: 0.7 mg/dL (ref 0.3–1.2)
Total Protein: 8.3 g/dL — ABNORMAL HIGH (ref 6.5–8.1)

## 2023-03-14 LAB — URINALYSIS, ROUTINE W REFLEX MICROSCOPIC
Bilirubin Urine: NEGATIVE
Glucose, UA: 50 mg/dL — AB
Hgb urine dipstick: NEGATIVE
Ketones, ur: NEGATIVE mg/dL
Nitrite: NEGATIVE
Protein, ur: NEGATIVE mg/dL
Specific Gravity, Urine: 1.009 (ref 1.005–1.030)
pH: 6 (ref 5.0–8.0)

## 2023-03-14 LAB — POC URINE PREG, ED: Preg Test, Ur: NEGATIVE

## 2023-03-14 NOTE — ED Provider Notes (Signed)
[facility] Provider Note  Patient Contact: 4:15 PM (approximate)   History   Breast Mass   HPI  Maria Dickerson is a 42 y.o. female who presents emergency department complaining of a new lesion to the left breast.  Patient noticed this while showering.  No significant pain, no fevers, chills.  No history of recurrent skin infections.  Patient denies any cystic changes within her breast tissue itself.  There is been no drainage from the nipple.     Physical Exam   Triage Vital Signs: ED Triage Vitals  Encounter Vitals Group     BP 03/14/23 1402 (!) 158/92     Systolic BP Percentile --      Diastolic BP Percentile --      Pulse Rate 03/14/23 1402 (!) 120     Resp 03/14/23 1402 16     Temp 03/14/23 1402 99 F (37.2 C)     Temp Source 03/14/23 1402 Oral     SpO2 03/14/23 1402 96 %     Weight 03/14/23 1401 169 lb 15.6 oz (77.1 kg)     Height 03/14/23 1401 5\' 2"  (1.575 m)     Head Circumference --      Peak Flow --      Pain Score 03/14/23 1401 0     Pain Loc --      Pain Education --      Exclude from Growth Chart --     Most recent vital signs: Vitals:   03/14/23 1402 03/14/23 1803  BP: (!) 158/92 (!) 131/90  Pulse: (!) 120 78  Resp: 16 16  Temp: 99 F (37.2 C) 98.2 F (36.8 C)  SpO2: 96% 96%     General: Alert and in no acute distress.   Cardiovascular:  Good peripheral perfusion Respiratory: Normal respiratory effort without tachypnea or retractions. Lungs CTAB.  Musculoskeletal: Full range of motion to all extremities.  Neurologic:  No gross focal neurologic deficits are appreciated.  Skin:   No rash noted.  Visualization of the left breast reveals a edematous lesion consistent with either localized cellulitis/abscess versus dermal cyst.  Suspect cyst based off of the presentation.  No lymphadenopathy in the axilla.  No other palpable findings within the breast tissue.  Palpation does not elicit significant tenderness.  No nipple  drainage with palpation. Other:   ED Results / Procedures / Treatments   Labs (all labs ordered are listed, but only abnormal results are displayed) Labs Reviewed  COMPREHENSIVE METABOLIC PANEL - Abnormal; Notable for the following components:      Result Value   CO2 21 (*)    Glucose, Bld 147 (*)    Total Protein 8.3 (*)    All other components within normal limits  URINALYSIS, ROUTINE W REFLEX MICROSCOPIC - Abnormal; Notable for the following components:   Color, Urine YELLOW (*)    APPearance CLOUDY (*)    Glucose, UA 50 (*)    Leukocytes,Ua TRACE (*)    Bacteria, UA MANY (*)    All other components within normal limits  LIPASE, BLOOD  CBC  POC URINE PREG, ED     EKG     RADIOLOGY  I personally viewed, evaluated, and interpreted these images as part of my medical decision making, as well as reviewing the written report by the radiologist.  ED Provider Interpretation: Appears that patient has well-circumscribed area measuring less than half centimeter in diameter consistent with small cyst.  No results found.  PROCEDURES:  Critical Care performed: No  Procedures   MEDICATIONS ORDERED IN ED: Medications - No data to display   IMPRESSION / MDM / ASSESSMENT AND PLAN / ED COURSE  I reviewed the triage vital signs and the nursing notes.                                 Differential diagnosis includes, but is not limited to, breast abscess, cellulitis, malignancy, fibrocystic changes, normal cyst   Patient's presentation is most consistent with acute presentation with potential threat to life or bodily function.   Patient's diagnosis is consistent with lesion of the breast.  Patient presents emergency department with a area of the left breast that appeared to have a "growth."  This was is visualized while showering.  No pain.  No other concerning symptoms.  Ultrasound in the ED was performed but had difficulty obtaining a radiologist read due to the breast  tissue.  I evaluated the images and it appears that patient has a well-circumscribed less than 0.5 cm lesion consistent with cyst.  There is no evidence of infectious source with physical exam.  This is firm, mobile and well-circumscribed.  Suspect cyst at this time.  I recommend follow-up with the breath center.  Patient is agreeable with this plan.  No medications at this time..  Patient is given ED precautions to return to the ED for any worsening or new symptoms.     FINAL CLINICAL IMPRESSION(S) / ED DIAGNOSES   Final diagnoses:  Lesion of skin of breast     Rx / DC Orders   ED Discharge Orders     None        Note:  This document was prepared using Dragon voice recognition software and may include unintentional dictation errors.   Lanette Hampshire 03/14/23 2134    Sharyn Creamer, MD 03/15/23 1428

## 2023-03-14 NOTE — ED Triage Notes (Addendum)
Noticed lump to left breast.  Denies pain, denies discharge, no redness or soreness.  Describes mass as small and hard.  Also c/o liver pain that has been ongoing for a while.

## 2023-03-14 NOTE — ED Notes (Signed)
Patient came out asking for something to drink. Patient informed that          the ultrasound report needs to be submitted first. Patient  was agreeable.

## 2023-04-13 ENCOUNTER — Emergency Department
Admission: EM | Admit: 2023-04-13 | Discharge: 2023-04-13 | Disposition: A | Discharge status: Home / Self Care | Payer: Medicare Other | Attending: Emergency Medicine | Admitting: Emergency Medicine

## 2023-04-13 ENCOUNTER — Other Ambulatory Visit: Payer: Self-pay

## 2023-04-13 DIAGNOSIS — Z760 Encounter for issue of repeat prescription: Secondary | ICD-10-CM | POA: Insufficient documentation

## 2023-04-13 MED ORDER — OLANZAPINE 20 MG PO TABS: ORAL_TABLET | ORAL | 0 refills | Status: DC

## 2023-04-13 MED ORDER — DIVALPROEX SODIUM 500 MG PO DR TAB: DELAYED_RELEASE_TABLET | ORAL | 0 refills | Status: DC

## 2023-04-13 NOTE — ED Provider Notes (Signed)
[facility] Emergency Department Provider Note     Event Date/Time   First MD Initiated Contact with Patient 04/13/23 1722     (approximate)   History   Medication Refill   HPI  Maria Dickerson is a 42 y.o. female with a history of 1 Personality disorder, bipolar disorder, dysthymia, depression, presents to the ED with request for medication refill.  Patient reports has been out of her olanzapine and divalproex for the last week.  She reports some difficulty with sleep induction without a medication.  She denies any headache, chest pain, or fevers at this time.  Denies any SI or HI.  She does report scheduled to see her provider on the 20th of this month for routine evaluation and refills.  Physical Exam   Triage Vital Signs: ED Triage Vitals [04/13/23 1619]  Encounter Vitals Group     BP (!) 121/92     Systolic BP Percentile      Diastolic BP Percentile      Pulse Rate (!) 104     Resp 18     Temp 98.1 F (36.7 C)     Temp Source Oral     SpO2 96 %     Weight      Height      Head Circumference      Peak Flow      Pain Score 0     Pain Loc      Pain Education      Exclude from Growth Chart     Most recent vital signs: Vitals:   04/13/23 1619  BP: (!) 121/92  Pulse: (!) 104  Resp: 18  Temp: 98.1 F (36.7 C)  SpO2: 96%    General Awake, no distress. NAD HEENT NCAT. PERRL. EOMI. No rhinorrhea. Mucous membranes are moist.  CV:  Good peripheral perfusion. RRR RESP:  Normal effort. CTA ABD:  No distention.     ED Results / Procedures / Treatments   Labs (all labs ordered are listed, but only abnormal results are displayed) Labs Reviewed - No data to display   EKG  RADIOLOGY  No results found.   PROCEDURES:  Critical Care performed: No  Procedures   MEDICATIONS ORDERED IN ED: Medications - No data to display   IMPRESSION / MDM / ASSESSMENT AND PLAN / ED COURSE  I reviewed the triage vital signs and the  nursing notes.                              Differential diagnosis includes, but is not limited to, medication refill, anxiety, depression, suicidal ideation  Patient's presentation is most consistent with acute, uncomplicated illness.  Patient's diagnosis is consistent with medication refill request.  With reassuring exam with no SI/HI at this time, with a scheduled appointment with her provider in 2 weeks.  Patient will be discharged home with prescriptions for divalproex and olanzapine as requested. Patient is to follow up with her provider in 2 weeks as scheduled, as needed or otherwise directed. Patient is given ED precautions to return to the ED for any worsening or new symptoms.   FINAL CLINICAL IMPRESSION(S) / ED DIAGNOSES   Final diagnoses:  Medication refill     Rx / DC Orders   ED Discharge Orders          Ordered    divalproex (DEPAKOTE) 500 MG DR tablet  04/13/23 1801    OLANZapine (ZYPREXA) 20 MG tablet        04/13/23 1801             Note:  This document was prepared using Dragon voice recognition software and may include unintentional dictation errors.    Lissa Hoard, PA-C 04/13/23 1804    Corena Herter, MD 04/13/23 2342

## 2023-04-13 NOTE — Discharge Instructions (Addendum)
Take your prescription meds as directed.  Follow-up with your provider in 2 weeks as scheduled.

## 2023-04-13 NOTE — ED Triage Notes (Signed)
Pt to ED via POV from home. Pt reports needs refill on Olanzapine and divalproex. Pt states has been out of medication for 1 week. Pt states unable to sleep without medication.

## 2024-01-05 ENCOUNTER — Emergency Department
Admission: EM | Admit: 2024-01-05 | Discharge: 2024-01-05 | Disposition: A | Discharge status: Home / Self Care | Attending: Emergency Medicine | Admitting: Emergency Medicine

## 2024-01-05 ENCOUNTER — Encounter: Payer: Self-pay | Admitting: Emergency Medicine

## 2024-01-05 ENCOUNTER — Other Ambulatory Visit: Payer: Self-pay

## 2024-01-05 DIAGNOSIS — Z760 Encounter for issue of repeat prescription: Secondary | ICD-10-CM | POA: Diagnosis present

## 2024-01-05 MED ORDER — DIVALPROEX SODIUM 500 MG PO DR TAB: DELAYED_RELEASE_TABLET | ORAL | 0 refills | Status: AC

## 2024-01-05 MED ORDER — OLANZAPINE 20 MG PO TABS: ORAL_TABLET | ORAL | 0 refills | Status: AC

## 2024-01-05 NOTE — ED Triage Notes (Signed)
 Patient to ED via POV for medication refill. PT states she is about to leave the country for 3 months and needs her prescriptions for that. States unable to get a hold of PCP.  Divalproex  DR 500mg  tab Olanzapine  20mg  tab

## 2024-01-06 NOTE — ED Provider Notes (Signed)
 [facility] Provider Note    Event Date/Time   First MD Initiated Contact with Patient 01/05/24 [phone]     (approximate)   History   Medication Refill   HPI  Maria Dickerson is a 43 y.o. female with history of dysthymia and borderline personality disorder and as listed in EMR presents to the emergency department for medication refill.  She is leaving the country for 3 months and has not been able to get in with primary care.  She is not currently out of the medications and has not missed any doses.  No symptoms of concern.      Physical Exam   Triage Vital Signs: ED Triage Vitals  Encounter Vitals Group     BP 01/05/24 0822 (!) 131/94     Systolic BP Percentile --      Diastolic BP Percentile --      Pulse Rate 01/05/24 0822 90     Resp 01/05/24 0822 17     Temp 01/05/24 0822 97.9 F (36.6 C)     Temp Source 01/05/24 0822 Oral     SpO2 01/05/24 0822 96 %     Weight 01/05/24 0823 150 lb (68 kg)     Height 01/05/24 0823 5\' 2"  (1.575 m)     Head Circumference --      Peak Flow --      Pain Score 01/05/24 0822 0     Pain Loc --      Pain Education --      Exclude from Growth Chart --     Most recent vital signs: Vitals:   01/05/24 0822  BP: (!) 131/94  Pulse: 90  Resp: 17  Temp: 97.9 F (36.6 C)  SpO2: 96%    General: Awake, no distress.  CV:  Good peripheral perfusion.  Resp:  Normal effort.  Abd:  No distention.  Other:     ED Results / Procedures / Treatments   Labs (all labs ordered are listed, but only abnormal results are displayed) Labs Reviewed - No data to display   EKG  Not indicated   RADIOLOGY  Image and radiology report reviewed and interpreted by me. Radiology report consistent with the same.  Not indicated  PROCEDURES:  Critical Care performed: No  Procedures   MEDICATIONS ORDERED IN ED:  Medications - No data to display   IMPRESSION / MDM / ASSESSMENT AND PLAN / ED COURSE   I have  reviewed the triage note.  Differential diagnosis includes, but is not limited to, medication refill  Patient's presentation is most consistent with acute, uncomplicated illness.  43 year old female presenting to the emergency department for medication refill.  See HPI.  Medications refilled as requested.  Patient was encouraged to go ahead and schedule a follow-up appointment for when she returns back to the [place] .      FINAL CLINICAL IMPRESSION(S) / ED DIAGNOSES   Final diagnoses:  Medication refill     Rx / DC Orders   ED Discharge Orders          Ordered    divalproex  (DEPAKOTE ) 500 MG DR tablet        01/05/24 0857    OLANZapine  (ZYPREXA ) 20 MG tablet        01/05/24 0857             Note:  This document was prepared using Dragon voice recognition software and may include unintentional dictation errors.   Sherryle Don, FNP  01/06/24 1004    Viviano Ground, MD 01/11/24 1028

## 2024-03-28 ENCOUNTER — Emergency Department

## 2024-03-28 ENCOUNTER — Emergency Department
Admission: EM | Admit: 2024-03-28 | Discharge: 2024-03-28 | Disposition: A | Discharge status: Home / Self Care | Attending: Emergency Medicine | Admitting: Emergency Medicine

## 2024-03-28 ENCOUNTER — Other Ambulatory Visit: Payer: Self-pay

## 2024-03-28 ENCOUNTER — Encounter: Payer: Self-pay | Admitting: *Deleted

## 2024-03-28 DIAGNOSIS — M25562 Pain in left knee: Secondary | ICD-10-CM | POA: Insufficient documentation

## 2024-03-28 DIAGNOSIS — Z85830 Personal history of malignant neoplasm of bone: Secondary | ICD-10-CM | POA: Insufficient documentation

## 2024-03-28 DIAGNOSIS — M25552 Pain in left hip: Secondary | ICD-10-CM | POA: Diagnosis not present

## 2024-03-28 DIAGNOSIS — F603 Borderline personality disorder: Secondary | ICD-10-CM | POA: Diagnosis not present

## 2024-03-28 DIAGNOSIS — Y900 Blood alcohol level of less than 20 mg/100 ml: Secondary | ICD-10-CM | POA: Diagnosis not present

## 2024-03-28 DIAGNOSIS — F32A Depression, unspecified: Secondary | ICD-10-CM

## 2024-03-28 DIAGNOSIS — F319 Bipolar disorder, unspecified: Secondary | ICD-10-CM | POA: Diagnosis present

## 2024-03-28 DIAGNOSIS — Z85828 Personal history of other malignant neoplasm of skin: Secondary | ICD-10-CM | POA: Insufficient documentation

## 2024-03-28 DIAGNOSIS — F609 Personality disorder, unspecified: Secondary | ICD-10-CM | POA: Insufficient documentation

## 2024-03-28 DIAGNOSIS — Z79899 Other long term (current) drug therapy: Secondary | ICD-10-CM | POA: Insufficient documentation

## 2024-03-28 DIAGNOSIS — F17210 Nicotine dependence, cigarettes, uncomplicated: Secondary | ICD-10-CM | POA: Diagnosis not present

## 2024-03-28 LAB — COMPREHENSIVE METABOLIC PANEL WITH GFR
ALT: 12 U/L (ref 0–44)
AST: 16 U/L (ref 15–41)
Albumin: 4.5 g/dL (ref 3.5–5.0)
Alkaline Phosphatase: 44 U/L (ref 38–126)
Anion gap: 11 (ref 5–15)
BUN: 5 mg/dL — ABNORMAL LOW (ref 6–20)
CO2: 22 mmol/L (ref 22–32)
Calcium: 9.1 mg/dL (ref 8.9–10.3)
Chloride: 103 mmol/L (ref 98–111)
Creatinine, Ser: 0.66 mg/dL (ref 0.44–1.00)
GFR, Estimated: >60 mL/min (ref >60)
Glucose, Bld: 119 mg/dL — ABNORMAL HIGH (ref 70–99)
Potassium: 3.9 mmol/L (ref 3.5–5.1)
Sodium: 136 mmol/L (ref 135–145)
Total Bilirubin: 0.7 mg/dL (ref 0.0–1.2)
Total Protein: 8.2 g/dL — ABNORMAL HIGH (ref 6.5–8.1)

## 2024-03-28 LAB — CBC
HCT: 42.8 % (ref 36.0–46.0)
Hemoglobin: 14.6 g/dL (ref 12.0–15.0)
MCH: 29.7 pg (ref 26.0–34.0)
MCHC: 34.1 g/dL (ref 30.0–36.0)
MCV: 87.2 fL (ref 80.0–100.0)
Platelets: 317 K/uL (ref 150–400)
RBC: 4.91 MIL/uL (ref 3.87–5.11)
RDW: 11.9 % (ref 11.5–15.5)
WBC: 9.9 K/uL (ref 4.0–10.5)
nRBC: 0 % (ref 0.0–0.2)

## 2024-03-28 LAB — URINE DRUG SCREEN, QUALITATIVE (ARMC ONLY)
Amphetamines, Ur Screen: NOT DETECTED
Barbiturates, Ur Screen: NOT DETECTED
Benzodiazepine, Ur Scrn: NOT DETECTED
Cannabinoid 50 Ng, Ur ~~LOC~~: NOT DETECTED
Cocaine Metabolite,Ur ~~LOC~~: NOT DETECTED
MDMA (Ecstasy)Ur Screen: NOT DETECTED
Methadone Scn, Ur: NOT DETECTED
Opiate, Ur Screen: NOT DETECTED
Phencyclidine (PCP) Ur S: NOT DETECTED
Tricyclic, Ur Screen: NOT DETECTED

## 2024-03-28 LAB — POC URINE PREG, ED: Preg Test, Ur: NEGATIVE

## 2024-03-28 LAB — ETHANOL: Alcohol, Ethyl (B): <15 mg/dL (ref <15)

## 2024-03-28 MED ORDER — DIVALPROEX SODIUM 500 MG PO DR TAB: 500.0000 mg | DELAYED_RELEASE_TABLET | Freq: Once | ORAL | Status: DC

## 2024-03-28 MED ORDER — OLANZAPINE 10 MG PO TABS: 20.0000 mg | ORAL_TABLET | Freq: Every day | ORAL | Status: DC

## 2024-03-28 NOTE — ED Notes (Signed)
 VOL/  PENDING  CONSULT

## 2024-03-28 NOTE — ED Notes (Signed)
 This RN and ED Provider to bedside to talk to pt. Pt states that she is depressed and the people she lives with are trying to throw her out of the house. PT denies HI/SI.   PT also stated that she smokes to much and that she has been having left knee/hip pain for several months. PT has a hx of neck cancer.

## 2024-03-28 NOTE — ED Notes (Addendum)
 Green shirt [place] pants Black sandals White bra  White panties Purple hair tye Yellow colored chain Yellow colored ear rings Black pocketbook Cell phone

## 2024-03-28 NOTE — Consult Note (Signed)
 [facility] Consult Note  Patient Name: Maria Dickerson MRN: [id number] DOB: 03-Aug-1981 DATE OF Consult: 03/28/2024  PRIMARY PSYCHIATRIC DIAGNOSES  1.  Bipolar 2.  Personality disorder  RECOMMENDATIONS   Recommendations: Medication recommendations: Zyprexa  20 mg po now , Depakote  500 mg po now. Pt forgot her nighttime medications. I recommend giving her, her doses so that she can get home calmly and not fight with her family right away and get sent back. Non-Medication/therapeutic recommendations: pt to follow up with her outpatient providers Communication: Treatment team members (and family members if applicable) who were involved in treatment/care discussions and planning, and with whom we spoke or engaged with via secure text/chat, include the following: Epic chat with treatment team Is inpatient psychiatric hospitalization recommended for this patient? No (Explain why): She does not meet criteria From a psychiatric perspective, is this patient appropriate for discharge to an outpatient setting/resource or other less restrictive environment for continued care?  Yes (Explain why): Patient had a tantrum and came her to rest Follow-Up Telepsychiatry C/L services: We will sign off for now. Please re-consult our service if needed for any concerning changes in the patient's condition, discharge planning, or questions. Communication: Treatment team members (and family members if applicable) who were involved in treatment/care discussions and planning, and with whom we spoke or engaged with via secure text/chat, include the following: Epic chat with treatment team  Thank you for involving us  in the care of this patient. If you have any additional questions or concerns, please call [phone] and ask for me or the provider on-call.  TELEPSYCHIATRY ATTESTATION & CONSENT  As the provider for this telehealth consult, I attest that I verified the patient's identity using two separate identifiers,  introduced myself to the patient, provided my credentials, disclosed my location, and performed this encounter via a HIPAA-compliant, real-time, face-to-face, two-way, interactive audio and video platform and with the full consent and agreement of the patient (or guardian as applicable.)  Patient physical location: [facility]. Telehealth provider physical location: home office in state of [place] .  Video start time: 2135 [facility] Time) Video end time: 2150 (Central Time)  IDENTIFYING DATA  Maria Dickerson is a 43 y.o. year-old female for whom a psychiatric consultation has been ordered by the primary provider. The patient was identified using two separate identifiers.  CHIEF COMPLAINT/REASON FOR CONSULT  depression  HISTORY OF PRESENT ILLNESS (HPI)  The patient is a poor historian in regards to sequencing. I have put this retelling in order for clarity. The patient just got back from [place], [place], to be exact. She was trying to sleep and her family woke her up. She got mad and decided to come to the ED so that she could get away from her family and get some rest. She lives with her parents. She appears to much younger than her stated years. She is childlike. She states that her parents were mad that she did not have money for the rent.  She is on disability. She is no longer feeling upset and wants to go home. She denies SI/HI/AVH. She presents as simple.   PAST PSYCHIATRIC HISTORY  Has been in the hospital once.  Denies an suicide attempts. Is currently on Depakote  and Zyprexa  Hx of Celexa , Tripleptal Otherwise as per HPI above.  PAST MEDICAL HISTORY  Past Medical History:  Diagnosis Date   Depression    Skin cancer      HOME MEDICATIONS  PTA Medications  Medication Sig   divalproex  (DEPAKOTE ) 500 MG  DR tablet Take 1 tablet (500mg ) by mouth twice daily.   OLANZapine  (ZYPREXA ) 20 MG tablet Take 1 tablet (20mg ) by mouth once daily at bedtime.   citalopram  (CELEXA ) 20 MG tablet Take 20  mg by mouth every morning. (Patient not taking: Reported on 03/28/2024)   Oxcarbazepine  (TRILEPTAL ) 300 MG tablet Take 300 mg by mouth 2 (two) times daily. (Patient not taking: Reported on 03/28/2024)     ALLERGIES  No Known Allergies  SOCIAL & SUBSTANCE USE HISTORY  Social History   Socioeconomic History   Marital status: Single    Spouse name: Not on file   Number of children: Not on file   Years of education: Not on file   Highest education level: Not on file  Occupational History   Not on file  Tobacco Use   Smoking status: Some Days    Types: Cigarettes   Smokeless tobacco: Never  Substance and Sexual Activity   Alcohol use: No   Drug use: No   Sexual activity: Not on file  Other Topics Concern   Not on file  Social History Narrative   Not on file   Social Drivers of Health   Financial Resource Strain: Not on file  Food Insecurity: Not on file  Transportation Needs: Not on file  Physical Activity: Not on file  Stress: Not on file  Social Connections: Not on file   Social History   Tobacco Use  Smoking Status Some Days   Types: Cigarettes  Smokeless Tobacco Never   Social History   Substance and Sexual Activity  Alcohol Use No   Social History   Substance and Sexual Activity  Drug Use No    Additional pertinent information .  FAMILY HISTORY  History reviewed. No pertinent family history. Family Psychiatric History (if known):  denies  MENTAL STATUS EXAM (MSE)  Mental Status Exam: General Appearance: Fairly Groomed  Orientation:  Full (Time, Place, and Person)  Memory:  Recent;   Fair Remote;   Fair  Concentration:  Concentration: Fair  Recall:  NA  Attention  Fair  Eye Contact:  Minimal  Speech:  accented  Language:  Fair  Volume:  Normal  Mood: euthymic  Affect:  Flat  Thought Process:  Disorganized  Thought Content:  Illogical  Suicidal Thoughts:  No  Homicidal Thoughts:  No  Judgement:  Poor  Insight:  Shallow  Psychomotor  Activity:  Normal  Akathisia:  No  Fund of Knowledge:  Poor    Assets:  Housing Social Support  Cognition:  Impaired,  Mild  ADL's:  Intact  AIMS (if indicated):       VITALS  Blood pressure (!) 147/120, pulse (!) 115, temperature 97.9 F (36.6 C), temperature source Oral, resp. rate 18, height 5' 2 (1.575 m), weight 70 kg, last menstrual period 03/14/2024, SpO2 98%.  LABS  Admission on 03/28/2024  Component Date Value Ref Range Status   Sodium 03/28/2024 136  135 - 145 mmol/L Final   Potassium 03/28/2024 3.9  3.5 - 5.1 mmol/L Final   Chloride 03/28/2024 103  98 - 111 mmol/L Final   CO2 03/28/2024 22  22 - 32 mmol/L Final   Glucose, Bld 03/28/2024 119 (H)  70 - 99 mg/dL Final   Glucose reference range applies only to samples taken after fasting for at least 8 hours.   BUN 03/28/2024 5 (L)  6 - 20 mg/dL Final   Creatinine, Ser 03/28/2024 0.66  0.44 - 1.00 mg/dL Final  Calcium 03/28/2024 9.1  8.9 - 10.3 mg/dL Final   Total Protein 91/74/7974 8.2 (H)  6.5 - 8.1 g/dL Final   Albumin 91/74/7974 4.5  3.5 - 5.0 g/dL Final   AST 91/74/7974 16  15 - 41 U/L Final   ALT 03/28/2024 12  0 - 44 U/L Final   Alkaline Phosphatase 03/28/2024 44  38 - 126 U/L Final   Total Bilirubin 03/28/2024 0.7  0.0 - 1.2 mg/dL Final   GFR, Estimated 03/28/2024 >60  >60 mL/min Final   Comment: (NOTE) Calculated using the CKD-EPI Creatinine Equation (2021)    Anion gap 03/28/2024 11  5 - 15 Final   Performed at [facility], [address] [place], [place] [address]   Alcohol, Ethyl (B) 03/28/2024 <15  <15 mg/dL Final   Comment: (NOTE) For medical purposes only. Performed at [facility], [address] [place], [place] [address]    WBC 03/28/2024 9.9  4.0 - 10.5 K/uL Final   RBC 03/28/2024 4.91  3.87 - 5.11 MIL/uL Final   Hemoglobin 03/28/2024 14.6  12.0 - 15.0 g/dL Final   HCT 91/74/7974 42.8  36.0 - 46.0 % Final   MCV 03/28/2024 87.2  80.0 - 100.0 fL Final   MCH 03/28/2024  29.7  26.0 - 34.0 pg Final   MCHC 03/28/2024 34.1  30.0 - 36.0 g/dL Final   RDW 91/74/7974 11.9  11.5 - 15.5 % Final   Platelets 03/28/2024 317  150 - 400 K/uL Final   nRBC 03/28/2024 0.0  0.0 - 0.2 % Final   Performed at [facility], [address] [place], [place] [address]   Tricyclic, Ur Screen 03/28/2024 NONE DETECTED  NONE DETECTED Final   Amphetamines, Ur Screen 03/28/2024 NONE DETECTED  NONE DETECTED Final   MDMA (Ecstasy)Ur Screen 03/28/2024 NONE DETECTED  NONE DETECTED Final   Cocaine Metabolite,Ur [place] 03/28/2024 NONE DETECTED  NONE DETECTED Final   Opiate, Ur Screen 03/28/2024 NONE DETECTED  NONE DETECTED Final   Phencyclidine (PCP) Ur S 03/28/2024 NONE DETECTED  NONE DETECTED Final   Cannabinoid 50 Ng, Ur [place] 03/28/2024 NONE DETECTED  NONE DETECTED Final   Barbiturates, Ur Screen 03/28/2024 NONE DETECTED  NONE DETECTED Final   Benzodiazepine, Ur Scrn 03/28/2024 NONE DETECTED  NONE DETECTED Final   Methadone Scn, Ur 03/28/2024 NONE DETECTED  NONE DETECTED Final   Comment: (NOTE) Tricyclics + metabolites, urine    Cutoff 1000 ng/mL Amphetamines + metabolites, urine  Cutoff 1000 ng/mL MDMA (Ecstasy), urine              Cutoff 500 ng/mL Cocaine Metabolite, urine          Cutoff 300 ng/mL Opiate + metabolites, urine        Cutoff 300 ng/mL Phencyclidine (PCP), urine         Cutoff 25 ng/mL Cannabinoid, urine                 Cutoff 50 ng/mL Barbiturates + metabolites, urine  Cutoff 200 ng/mL Benzodiazepine, urine              Cutoff 200 ng/mL Methadone, urine                   Cutoff 300 ng/mL  The urine drug screen provides only a preliminary, unconfirmed analytical test result and should not be used for non-medical purposes. Clinical consideration and professional judgment should be applied to any positive drug screen result due to possible interfering substances.  A more specific alternate chemical method must be used in order to obtain a confirmed analytical  result. Gas chromatography / mass spectrometry (GC/MS) is the preferred confirm                          atory method. Performed at [facility], [address] [place], [place] [address]    Preg Test, Ur 03/28/2024 NEGATIVE  NEGATIVE Final   Comment:        THE SENSITIVITY OF THIS METHODOLOGY IS >20 mIU/mL.     PSYCHIATRIC REVIEW OF SYSTEMS (ROS)  ROS: Notable for the following relevant positive findings: ROS  Additional findings:      Musculoskeletal: No abnormal movements observed      Gait & Station: Laying/Sitting      Pain Screening: Denies      Nutrition & Dental Concerns: no concerns  RISK FORMULATION/ASSESSMENT  Is the patient experiencing any suicidal or homicidal ideations: No     Protective factors considered for safety management: not SI, future oriented, wants to go home and will follow up with her outpatient providers  Risk factors/concerns considered for safety management:  Impulsivity Unmarried  Is there a safety management plan with the patient and treatment team to minimize risk factors and promote protective factors: Yes           Explain: in a behavioral health room in the ED Is crisis care placement or psychiatric hospitalization recommended: No     Based on my current evaluation and risk assessment, patient is determined at this time to be at:  Low risk  *RISK ASSESSMENT Risk assessment is a dynamic process; it is possible that this patient's condition, and risk level, may change. This should be re-evaluated and managed over time as appropriate. Please re-consult psychiatric consult services if additional assistance is needed in terms of risk assessment and management. If your team decides to discharge this patient, please advise the patient how to best access emergency psychiatric services, or to call 911, if their condition worsens or they feel unsafe in any way.   Serenitie Vinton A Stefanie Hodgens, NP Telepsychiatry Consult Services

## 2024-03-28 NOTE — Discharge Instructions (Signed)
 Please call your oncologist to discuss your left leg pain.  Your x-rays were negative for screening for bony cancer but with your history you may need further workup with PET scans, MRIs.

## 2024-03-28 NOTE — ED Notes (Signed)
 Tele Psych Chat Initiated.

## 2024-03-28 NOTE — ED Provider Notes (Addendum)
 [facility] Provider Note    Event Date/Time   First MD Initiated Contact with Patient 03/28/24 1712     (approximate)   History   Psychiatric Evaluation   HPI  Maria Dickerson is a 43 y.o. female who comes in with feeling depressed.  Reports been under a lot of stress with her family and that she feels like they do not want her anymore and this has made her really sad.  She denies any active SI plan except that she just smokes a lot of cigarettes denies any other alcohol or drug use.  When I asked if she had any medical complains- She does report some worsening pain in her left leg in the hip and knee for 5 months.  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  When she had that happen she does report that she had a swelling mass but this time she has not noticed any swelling of her leg.  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  Physical Exam   Triage Vital Signs: ED Triage Vitals [03/28/24 1703]  Encounter Vitals Group     BP (!) 147/120     Girls Systolic BP Percentile      Girls Diastolic BP Percentile      Boys Systolic BP Percentile      Boys Diastolic BP Percentile      Pulse Rate (!) 115     Resp 18     Temp 97.9 F (36.6 C)     Temp Source Oral     SpO2 98 %     Weight 154 lb 5.2 oz (70 kg)     Height 5' 2 (1.575 m)     Head Circumference      Peak Flow      Pain Score 0     Pain Loc      Pain Education      Exclude from Growth Chart     Most recent vital signs: Vitals:   03/28/24 1703  BP: (!) 147/120  Pulse: (!) 115  Resp: 18  Temp: 97.9 F (36.6 C)  SpO2: 98%     General: Awake, no distress.  CV:  Good peripheral perfusion.  Resp:  Normal effort.  Abd:  No distention.  Other:  Positive depression but  no active plan.  Pain on the left hip left knee no redness no warmth noted.  Able to ambulate good distal pulse. No swelling noted.  ED Results / Procedures / Treatments   Labs (all labs ordered are listed, but only abnormal results are displayed) Labs Reviewed  COMPREHENSIVE METABOLIC PANEL WITH GFR  ETHANOL  CBC  URINE DRUG SCREEN, QUALITATIVE ([facility] ONLY)  POC URINE PREG, ED      RADIOLOGY I have reviewed the xray personally and interpreted no fracture    PROCEDURES:  Critical Care performed: No  Procedures   MEDICATIONS ORDERED IN ED: Medications - No data to display   IMPRESSION / MDM / ASSESSMENT AND PLAN / ED COURSE  I reviewed the triage vital signs and the nursing notes.   Patient's presentation is most consistent with acute presentation with potential threat to life or bodily function.   Patient comes in for psychiatric evaluation.  Does report depression but no active  plan here voluntary and willing to stay voluntarily.  Patient does report when asked about any medical concerns some hip and knee pain has been going on for months.  On examination do not see any evidence of acute limb ischemia, infection process however she does have a history of sarcoma so we will get x-rays to screen for any large bony involvement.  I did recommend she needs to follow-up with her oncologist for further workup as we are not able to fully evaluate for cancer and with her history she would need to have consideration for PET scan or further imaging such as CT, MRI as needed but this is not emergent today.  Patient states that she will do that when she gets out .  She is ambulatory here without any issues.  D/d includes but is not limited to psychiatric disease, behavioral/personality disorder, inadequate socioeconomic support, medical.  Based on HPI, exam, unremarkable labs, no concern for acute medical problem at this time. No rigidity, clonus, hyperthermia, focal neurologic  deficit, diaphoresis, tachycardia, meningismus, ataxia, gait abnormality or other finding to suggest this visit represents a non-psychiatric problem. Screening labs reviewed.    Given this, pt medically cleared, to be dispositioned per Psych.    The patient has been placed in psychiatric observation due to the need to provide a safe environment for the patient while obtaining psychiatric consultation and evaluation, as well as ongoing medical and medication management to treat the patient's condition.  The patient has not been placed under full IVC at this time.   Patient was cleared by discharge by psychiatry and they did want her to get her doses of Zyprexa , Depakote  before going.  However given patient is driving home we did not give these before leaving.  Patient was slightly tachycardic when she got here unfortunately they did not get a repeat heart rate before patient left per protocol but her blood work was very reassuring and she denied any symptoms and did appear slightly anxious and upset with her issues with her family which she first appeared her blood work was without any evidence of issues.  Her Tylenol , salicylate, valproic acid  level never went in process but I did call lab to ensure that they can add these on and they stated they could.  Given I am now leaving I have alerted our charge nurse follow-up on these results and I have flagged these for my inbox for tomorrow that if there are any major issues we can call patient back.     FINAL CLINICAL IMPRESSION(S) / ED DIAGNOSES   Final diagnoses:  Depression, unspecified depression type     Rx / DC Orders   ED Discharge Orders     None        Note:  This document was prepared using Dragon voice recognition software and may include unintentional dictation errors.   Ernest Ronal BRAVO, MD 03/28/24 2014    Ernest Ronal BRAVO, MD 03/28/24 2018    Ernest Ronal BRAVO, MD 03/28/24 [phone]

## 2024-03-28 NOTE — ED Triage Notes (Signed)
 Pt ambulatory to triage.  Pt reports feeling depressed.  Pt denies HI.  Pt reports SI.  Denies drugs or etoh use.  Pt calm and cooperative.

## 2024-03-28 NOTE — ED Notes (Signed)
 Pt belongings returned and pt dressed out of burgundy scrubs

## 2024-03-29 LAB — ACETAMINOPHEN LEVEL: Acetaminophen (Tylenol), Serum: <10 ug/mL — ABNORMAL LOW (ref 10–30)

## 2024-03-29 LAB — SALICYLATE LEVEL: Salicylate Lvl: <7 mg/dL — ABNORMAL LOW (ref 7.0–30.0)

## 2024-03-29 LAB — VALPROIC ACID LEVEL: Valproic Acid Lvl: 48 ug/mL — ABNORMAL LOW (ref 50–100)

## 2024-04-11 ENCOUNTER — Encounter: Payer: Self-pay | Admitting: *Deleted

## 2024-04-11 ENCOUNTER — Other Ambulatory Visit: Payer: Self-pay

## 2024-04-11 ENCOUNTER — Emergency Department
Admission: EM | Admit: 2024-04-11 | Discharge: 2024-04-11 | Disposition: A | Discharge status: Against Medical Advice | Attending: Emergency Medicine | Admitting: Emergency Medicine

## 2024-04-11 ENCOUNTER — Emergency Department

## 2024-04-11 DIAGNOSIS — R55 Syncope and collapse: Secondary | ICD-10-CM | POA: Insufficient documentation

## 2024-04-11 DIAGNOSIS — Z5321 Procedure and treatment not carried out due to patient leaving prior to being seen by health care provider: Secondary | ICD-10-CM | POA: Diagnosis not present

## 2024-04-11 LAB — BASIC METABOLIC PANEL WITH GFR
Anion gap: 11 (ref 5–15)
BUN: 11 mg/dL (ref 6–20)
CO2: 22 mmol/L (ref 22–32)
Calcium: 9 mg/dL (ref 8.9–10.3)
Chloride: 104 mmol/L (ref 98–111)
Creatinine, Ser: 0.74 mg/dL (ref 0.44–1.00)
GFR, Estimated: >60 mL/min (ref >60)
Glucose, Bld: 132 mg/dL — ABNORMAL HIGH (ref 70–99)
Potassium: 4.3 mmol/L (ref 3.5–5.1)
Sodium: 137 mmol/L (ref 135–145)

## 2024-04-11 LAB — CBC
HCT: 41.2 % (ref 36.0–46.0)
Hemoglobin: 14.3 g/dL (ref 12.0–15.0)
MCH: 30.1 pg (ref 26.0–34.0)
MCHC: 34.7 g/dL (ref 30.0–36.0)
MCV: 86.7 fL (ref 80.0–100.0)
Platelets: 309 K/uL (ref 150–400)
RBC: 4.75 MIL/uL (ref 3.87–5.11)
RDW: 12.1 % (ref 11.5–15.5)
WBC: 11.7 K/uL — ABNORMAL HIGH (ref 4.0–10.5)
nRBC: 0 % (ref 0.0–0.2)

## 2024-04-11 LAB — TROPONIN I (HIGH SENSITIVITY): Troponin I (High Sensitivity): <2 ng/L (ref <18)

## 2024-04-11 NOTE — ED Triage Notes (Signed)
 Pt brought in via ems from home.  Pt states she is afraid of her father.  Pt states she is not sleeping and passed out today.  Pt alert  speech clear.

## 2024-04-22 ENCOUNTER — Other Ambulatory Visit: Payer: Self-pay | Admitting: Family Medicine

## 2024-04-22 DIAGNOSIS — N6320 Unspecified lump in the left breast, unspecified quadrant: Secondary | ICD-10-CM

## 2024-04-27 ENCOUNTER — Other Ambulatory Visit: Payer: Self-pay | Admitting: Family Medicine

## 2024-04-27 DIAGNOSIS — N644 Mastodynia: Secondary | ICD-10-CM

## 2024-04-29 ENCOUNTER — Ambulatory Visit
Admission: RE | Admit: 2024-04-29 | Discharge: 2024-04-29 | Disposition: A | Discharge status: Home / Self Care | Source: Ambulatory Visit | Attending: Family Medicine | Admitting: Family Medicine

## 2024-04-29 ENCOUNTER — Other Ambulatory Visit: Payer: Self-pay | Admitting: Family Medicine

## 2024-04-29 DIAGNOSIS — N644 Mastodynia: Secondary | ICD-10-CM | POA: Diagnosis present

## 2024-04-29 DIAGNOSIS — N6320 Unspecified lump in the left breast, unspecified quadrant: Secondary | ICD-10-CM | POA: Diagnosis present

## 2024-04-29 MED ORDER — LIDOCAINE HCL (PF) 1 % IJ SOLN
8.0000 mL | Freq: Once | INTRAMUSCULAR | Status: AC
  Administered 2024-04-29: 8 mL
  Filled 2024-04-29: qty 8

## 2024-05-02 ENCOUNTER — Other Ambulatory Visit: Payer: Self-pay | Admitting: Family Medicine

## 2024-05-02 DIAGNOSIS — R928 Other abnormal and inconclusive findings on diagnostic imaging of breast: Secondary | ICD-10-CM

## 2024-05-03 LAB — BODY FLUID CULTURE W GRAM STAIN: Culture: NO GROWTH

## 2024-05-12 ENCOUNTER — Ambulatory Visit
Admission: RE | Admit: 2024-05-12 | Discharge: 2024-05-12 | Disposition: A | Discharge status: Home / Self Care | Source: Ambulatory Visit | Attending: Family Medicine | Admitting: Family Medicine

## 2024-05-12 DIAGNOSIS — R928 Other abnormal and inconclusive findings on diagnostic imaging of breast: Secondary | ICD-10-CM | POA: Diagnosis present

## 2024-08-16 ENCOUNTER — Emergency Department
Admission: EM | Admit: 2024-08-16 | Discharge: 2024-08-16 | Disposition: A | Discharge status: Home / Self Care | Attending: Emergency Medicine | Admitting: Emergency Medicine

## 2024-08-16 DIAGNOSIS — F32A Depression, unspecified: Secondary | ICD-10-CM

## 2024-08-16 DIAGNOSIS — F418 Other specified anxiety disorders: Secondary | ICD-10-CM | POA: Diagnosis not present

## 2024-08-16 DIAGNOSIS — R45851 Suicidal ideations: Secondary | ICD-10-CM | POA: Insufficient documentation

## 2024-08-16 DIAGNOSIS — F603 Borderline personality disorder: Secondary | ICD-10-CM

## 2024-08-16 DIAGNOSIS — F17210 Nicotine dependence, cigarettes, uncomplicated: Secondary | ICD-10-CM | POA: Diagnosis not present

## 2024-08-16 DIAGNOSIS — F419 Anxiety disorder, unspecified: Secondary | ICD-10-CM

## 2024-08-16 LAB — URINE DRUG SCREEN
Amphetamines: NEGATIVE
Barbiturates: NEGATIVE
Benzodiazepines: NEGATIVE
Cocaine: NEGATIVE
Fentanyl: NEGATIVE
Methadone Scn, Ur: NEGATIVE
Opiates: NEGATIVE
Tetrahydrocannabinol: NEGATIVE

## 2024-08-16 LAB — CBC
HCT: 41 % (ref 36.0–46.0)
Hemoglobin: 13.8 g/dL (ref 12.0–15.0)
MCH: 29.9 pg (ref 26.0–34.0)
MCHC: 33.7 g/dL (ref 30.0–36.0)
MCV: 88.9 fL (ref 80.0–100.0)
Platelets: 328 K/uL (ref 150–400)
RBC: 4.61 MIL/uL (ref 3.87–5.11)
RDW: 11.9 % (ref 11.5–15.5)
WBC: 9.7 K/uL (ref 4.0–10.5)
nRBC: 0 % (ref 0.0–0.2)

## 2024-08-16 LAB — COMPREHENSIVE METABOLIC PANEL WITH GFR
ALT: 11 U/L (ref 0–44)
AST: 19 U/L (ref 15–41)
Albumin: 4.9 g/dL (ref 3.5–5.0)
Alkaline Phosphatase: 51 U/L (ref 38–126)
Anion gap: 13 (ref 5–15)
BUN: <5 mg/dL — ABNORMAL LOW (ref 6–20)
CO2: 20 mmol/L — ABNORMAL LOW (ref 22–32)
Calcium: 9.1 mg/dL (ref 8.9–10.3)
Chloride: 103 mmol/L (ref 98–111)
Creatinine, Ser: 0.78 mg/dL (ref 0.44–1.00)
GFR, Estimated: >60 mL/min
Glucose, Bld: 111 mg/dL — ABNORMAL HIGH (ref 70–99)
Potassium: 3.8 mmol/L (ref 3.5–5.1)
Sodium: 136 mmol/L (ref 135–145)
Total Bilirubin: 0.4 mg/dL (ref 0.0–1.2)
Total Protein: 8 g/dL (ref 6.5–8.1)

## 2024-08-16 LAB — VALPROIC ACID LEVEL: Valproic Acid Lvl: 40 ug/mL — ABNORMAL LOW (ref 50–100)

## 2024-08-16 LAB — POC URINE PREG, ED: Preg Test, Ur: NEGATIVE

## 2024-08-16 LAB — ETHANOL: Alcohol, Ethyl (B): <15 mg/dL

## 2024-08-16 NOTE — Consult Note (Signed)
 [facility] Consult Note  Patient Name: Maria Dickerson MRN: [id number] DOB: 1981-01-20 DATE OF Consult: 08/16/2024 Consult Order details:  Orders (From admission, onward)     Start     Ordered   08/16/24 1616  CONSULT TO CALL ACT TEAM       Ordering Provider: Suzanne Kirsch, MD  Provider:  (Not yet assigned)  Question:  Reason for Consult?  Answer:  Psych consult   08/16/24 1615   08/16/24 1616  IP CONSULT TO PSYCHIATRY       Ordering Provider: Suzanne Kirsch, MD  Provider:  (Not yet assigned)  Question:  Reason for consult:  Answer:  Medication management   08/16/24 1615            PRIMARY PSYCHIATRIC DIAGNOSES  1.  Borderline personality disorder   RECOMMENDATIONS  Recommendations: Medication recommendations: Continue home Depakote  and olanzapine  per outpatient psychiatrist, patient does not know doses Non-Medication/therapeutic recommendations: Follow up with outpatient therapist and psychiatrist tomorrow; crisis line information; ED return precautions  Is inpatient psychiatric hospitalization recommended for this patient? No (Explain why): Denies suicidal ideation, is future oriented, has supportive family, is engaged in outpatient mental health treatment, is no longer emotionally dysregulated From a psychiatric perspective, is this patient appropriate for discharge to an outpatient setting/resource or other less restrictive environment for continued care?  Yes (Explain why): As above and has outpatient appointments with her therapist and psychiatrist tomorrow  Follow-Up Telepsychiatry C/L services: We will sign off for now. Please re-consult our service if needed for any concerning changes in the patient's condition, discharge planning, or questions. Communication: Treatment team members (and family members if applicable) who were involved in treatment/care discussions and planning, and with whom we spoke or engaged with via secure text/chat, include the following:  Team via Epic chat    Maria Dickerson is a 44 year old female with a history of borderline personality disorder and bipolar II disorder who presents to the ED with worsening depressive symptoms and suicidal ideation. Chart reviewed, valproic acid  level 40. Psychiatry consulted for evaluation and management. On evaluation, patient noted to be normoverbal, calm, cooperative, euthymic, linear and goal directed, not appearing internally preoccupied, not responding to internal stimuli, alert and oriented x 3. Patient reports she felt upset her mom was not talking to her and she came to the ED to get away from her family for a little bit. Patient denies passive and active suicidal ideation, intent, plan. Patient states she does not recall making suicidal statements in the ED. She states, I don't want to hurt myself or kill myself. She denies current passive and active suicidal ideation, intent, plan. Denies access to firearms. Patient reports her nephews and family are protective against suicide. Patient denies depressed mood, other depressive symptoms. Reports she is sleeping 8 hours per night. Denies symptoms consistent with mania/hypomania including grandiosity, racing thoughts, decreased need for sleep, prolonged elevated/irritable mood. Denies paranoia, auditory and visual hallucinations, homicidal ideation. She reports she has a psychiatry appointment and therapy appointment tomorrow morning. Patient reports she wants to go home and feels safe to go home. She will call 911 or return to the ED if suicidal ideation, homicidal ideation or other psychiatric crisis.  I personally spent a total of 48 minutes in the care of the patient today including preparing to see the patient, getting/reviewing separately obtained history, performing a medically appropriate exam/evaluation, counseling and educating, referring and communicating with other health care professionals, documenting clinical information in the EHR,  independently interpreting  results, and coordinating care.  Thank you for involving us  in the care of this patient. If you have any additional questions or concerns, please call [phone] and ask for me or the provider on-call.  TELEPSYCHIATRY ATTESTATION & CONSENT  As the provider for this telehealth consult, I attest that I verified the patients identity using two separate identifiers, introduced myself to the patient, provided my credentials, disclosed my location, and performed this encounter via a HIPAA-compliant, real-time, face-to-face, two-way, interactive audio and video platform and with the full consent and agreement of the patient (or guardian as applicable.)  Patient physical location: ED in [facility]  Telehealth provider physical location: home office in state of [place]    Video start time: 1930 EST Video end time: 1945 EST   IDENTIFYING DATA  Maria Dickerson is a 44 y.o. year-old female for whom a psychiatric consultation has been ordered by the primary provider. The patient was identified using two separate identifiers.  CHIEF COMPLAINT/REASON FOR CONSULT  Depression   HISTORY OF PRESENT ILLNESS (HPI)  Maria Dickerson is a 44 year old female with a history of borderline personality disorder and bipolar II disorder who presents to the ED with worsening depressive symptoms and suicidal ideation. Chart reviewed, valproic acid  level 40. Psychiatry consulted for evaluation and management.   On evaluation, patient noted to be normoverbal, calm, cooperative, euthymic, linear and goal directed, not appearing internally preoccupied, not responding to internal stimuli, alert and oriented x 3. Patient states she is excellent and is noted to be smiling broadly. She states her mom didn't talk to her today because she was mad at her for not looking after her nephew. She states I just didn't feel like watching him. She states she felt upset her mom was not talking to her and  she came to the ED to get away from her family for a little bit. Patient denies passive and active suicidal ideation, intent, plan. Patient states she does not recall making suicidal statements in the ED. She states, I don't want to hurt myself or kill myself. She denies current passive and active suicidal ideation, intent, plan. Denies access to firearms She states, I don't want to hurt myself. I love myself. Patient reports her nephews and family are protective against suicide. She states she has newborn twin nephews who are her brother's kids. Patient denies depressed mood, other depressive symptoms. Reports she is sleeping 8 hours per night. Denies symptoms consistent with mania/hypomania including grandiosity, racing thoughts, decreased need for sleep, prolonged elevated/irritable mood. Denies paranoia, auditory and visual hallucinations, homicidal ideation. Patient reports she came to the [place]  from [place] in 1999. Reports she lives with her father, mother, brother, sister-in-law and her nephews. Patient reports that being in the ED has helped me and made me feel better. She reports she has a psychiatry appointment and therapy appointment tomorrow morning. Patient reports she wants to go home and feels safe to go home. She will call 911 or return to the ED if suicidal ideation, homicidal ideation or other psychiatric crisis.   PAST PSYCHIATRIC HISTORY  Inpt admissions: Once Outpt treatment:  Has an appointment with her psychiatrist and therapist tomorrow  Prior medication trials:  Trileptal , Depakote , olanzapine , Buspar  Suicide attempts:  Denies  Non-suicidal self injury: Per chart review history of drinking Windex in setting of interpersonal conflict to scare others. Patient denies this.  Violence: Denies  History of Trauma, abuse, neglect, and exploitation: Denies   C-SSRS 1) In the past month have you  wished you were dead or wished you could go to sleep and not wake up? []  Yes [x]   No 2) In  the past month have you actually had any thoughts of killing yourself? []   Yes  [x]   No If YES to 2, ask questions 3, 4, 5, and 6. If NO to 2, go directly to question 6 3) In the past month have you been thinking about how you might do this? []   Yes  []   No 4) In the past month have you had these thoughts and had some intention of acting on them?  []   Yes []   No 5) In the past month have you started to work out or worked out the details of how to kill yourself? Do you intend to carry out this plan? []   Yes []   No 6) Have you ever done anything, started to do anything, or prepared to do anything to end your life? []   Yes [x]   No Otherwise as per HPI above.  PAST MEDICAL HISTORY  Past Medical History:  Diagnosis Date   Depression    Skin cancer      HOME MEDICATIONS  PTA Medications  Medication Sig   divalproex  (DEPAKOTE ) 500 MG DR tablet Take 500 mg by mouth 3 (three) times daily.   citalopram  (CELEXA ) 20 MG tablet Take 20 mg by mouth every morning. (Patient not taking: Reported on 03/28/2024)   Oxcarbazepine  (TRILEPTAL ) 300 MG tablet Take 300 mg by mouth 2 (two) times daily. (Patient not taking: Reported on 03/28/2024)   divalproex  (DEPAKOTE ) 500 MG DR tablet Take 1 tablet (500mg ) by mouth twice daily.   OLANZapine  (ZYPREXA ) 20 MG tablet Take 1 tablet (20mg ) by mouth once daily at bedtime.     ALLERGIES  Allergies[1]  SOCIAL & SUBSTANCE USE HISTORY  Social History   Socioeconomic History   Marital status: Single    Spouse name: Not on file   Number of children: Not on file   Years of education: Not on file   Highest education level: Not on file  Occupational History   Not on file  Tobacco Use   Smoking status: Some Days    Types: Cigarettes   Smokeless tobacco: Never  Vaping Use   Vaping status: Never Used  Substance and Sexual Activity   Alcohol use: No   Drug use: No   Sexual activity: Not on file  Other Topics Concern   Not on file  Social History Narrative   Not  on file   Social Drivers of Health   Tobacco Use: High Risk (08/16/2024)   Patient History    Smoking Tobacco Use: Some Days    Smokeless Tobacco Use: Never    Passive Exposure: Not on file  Financial Resource Strain: Low Risk  (03/29/2024)   Received from [facility]   Overall Financial Resource Strain (CARDIA)    Difficulty of Paying Living Expenses: Not hard at all  Food Insecurity: No Food Insecurity (03/29/2024)   Received from [facility]   Epic    Within the past 12 months, you worried that your food would run out before you got the money to buy more.: Never true    Within the past 12 months, the food you bought just didn't last and you didn't have money to get more.: Never true  Transportation Needs: No Transportation Needs (03/29/2024)   Received from [facility] - Transportation    In  the past 12 months, has lack of transportation kept you from medical appointments or from getting medications?: No    Lack of Transportation (Non-Medical): No  Physical Activity: Not on file  Stress: Not on file  Social Connections: Not on file  Depression (EYV7-0): Not on file  Alcohol Screen: Not on file  Housing: Low Risk  (03/29/2024)   Received from [facility]   Epic    In the last 12 months, was there a time when you were not able to pay the mortgage or rent on time?: No    In the past 12 months, how many times have you moved where you were living?: 0    At any time in the past 12 months, were you homeless or living in a shelter (including now)?: No  Utilities: Not At Risk (03/29/2024)   Received from [facility]   Epic    In the past 12 months has the electric, gas, oil, or water company threatened to shut off services in your home?: No  Health Literacy: Not on file   Tobacco Use History[2] Social History   Substance and Sexual Activity  Alcohol Use No   Social History    Substance and Sexual Activity  Drug Use No     FAMILY HISTORY  History reviewed. No pertinent family history.   MENTAL STATUS EXAM (MSE)  Mental Status Exam: General Appearance: Neat  Orientation:  Full (Time, Place, and Person)  Memory:  Immediate;   Good Recent;   Good  Concentration:  Concentration: Good  Recall:  Good  Attention  Good  Eye Contact:  Good  Speech:  Clear and Coherent  Language:  Good  Volume:  Normal  Mood: Excellent  Affect:  Congruent  Thought Process:  Coherent  Thought Content:  Computation  Suicidal Thoughts:  No  Homicidal Thoughts:  No  Judgement:  Fair  Insight:  Fair  Psychomotor Activity:  Normal  Akathisia:  Negative  Fund of Knowledge:  Good    Assets:  Communication Skills Desire for Improvement Housing Resilience Social Support  Cognition:  WNL  ADL's:  Intact  AIMS (if indicated):       VITALS  Blood pressure (!) 158/96, pulse (!) 106, temperature 98.3 F (36.8 C), temperature source Oral, resp. rate 18, height 5' 2 (1.575 m), weight 70 kg, last menstrual period 07/25/2024, SpO2 95%.  LABS  Admission on 08/16/2024  Component Date Value Ref Range Status   Sodium 08/16/2024 136  135 - 145 mmol/L Final   Potassium 08/16/2024 3.8  3.5 - 5.1 mmol/L Final   Chloride 08/16/2024 103  98 - 111 mmol/L Final   CO2 08/16/2024 20 (L)  22 - 32 mmol/L Final   Glucose, Bld 08/16/2024 111 (H)  70 - 99 mg/dL Final   Glucose reference range applies only to samples taken after fasting for at least 8 hours.   BUN 08/16/2024 <5 (L)  6 - 20 mg/dL Final   Creatinine, Ser 08/16/2024 0.78  0.44 - 1.00 mg/dL Final   Calcium 98/86/7973 9.1  8.9 - 10.3 mg/dL Final   Total Protein 98/86/7973 8.0  6.5 - 8.1 g/dL Final   Albumin 98/86/7973 4.9  3.5 - 5.0 g/dL Final   AST 98/86/7973 19  15 - 41 U/L Final   ALT 08/16/2024 11  0 - 44 U/L Final   Alkaline Phosphatase 08/16/2024 51  38 - 126 U/L Final   Total Bilirubin 08/16/2024 0.4  0.0 -  1.2 mg/dL  Final   GFR, Estimated 08/16/2024 >60  >60 mL/min Final   Comment: (NOTE) Calculated using the CKD-EPI Creatinine Equation (2021)    Anion gap 08/16/2024 13  5 - 15 Final   Performed at [facility], [address] [place], [place] [address]   Alcohol, Ethyl (B) 08/16/2024 <15  <15 mg/dL Final   Comment: (NOTE) For medical purposes only. Performed at [facility], [address] [place], [place] [address]    WBC 08/16/2024 9.7  4.0 - 10.5 K/uL Final   RBC 08/16/2024 4.61  3.87 - 5.11 MIL/uL Final   Hemoglobin 08/16/2024 13.8  12.0 - 15.0 g/dL Final   HCT 98/86/7973 41.0  36.0 - 46.0 % Final   MCV 08/16/2024 88.9  80.0 - 100.0 fL Final   MCH 08/16/2024 29.9  26.0 - 34.0 pg Final   MCHC 08/16/2024 33.7  30.0 - 36.0 g/dL Final   RDW 98/86/7973 11.9  11.5 - 15.5 % Final   Platelets 08/16/2024 328  150 - 400 K/uL Final   nRBC 08/16/2024 0.0  0.0 - 0.2 % Final   Performed at [facility], [address] [place], [place] [address]   Preg Test, Ur 08/16/2024 NEGATIVE  NEGATIVE Final   Comment:        THE SENSITIVITY OF THIS METHODOLOGY IS >20 mIU/mL.    Valproic Acid  Lvl 08/16/2024 40 (L)  50 - 100 ug/mL Final   Performed at [facility], [address] [place], [place] [address]    PSYCHIATRIC REVIEW OF SYSTEMS (ROS)  ROS: Notable for the following relevant positive findings: Review of Systems  Psychiatric/Behavioral: Negative.      Additional findings:      Musculoskeletal: No abnormal movements observed      Gait & Station: Laying/Sitting      Pain Screening: Denies      Nutrition & Dental Concerns: n/a  RISK FORMULATION/ASSESSMENT  Is the patient experiencing any suicidal or homicidal ideations: No  Protective factors considered for safety management: Nephews, family, future oriented, identifies reasons to live, engaged in outpatient mental health treatment  Risk factors/concerns considered for safety management:   Depression Impulsivity  Is there a safety management plan with the patient and treatment team to minimize risk factors and promote protective factors: Yes           Explain: -Follow up with outpatient therapist and psychiatrist tomorrow; crisis line information; ED return precautions  Is crisis care placement or psychiatric hospitalization recommended: No     Based on my current evaluation and risk assessment, patient is determined at this time to be at:  Low risk  *RISK ASSESSMENT Risk assessment is a dynamic process; it is possible that this patient's condition, and risk level, may change. This should be re-evaluated and managed over time as appropriate. Please re-consult psychiatric consult services if additional assistance is needed in terms of risk assessment and management. If your team decides to discharge this patient, please advise the patient how to best access emergency psychiatric services, or to call 911, if their condition worsens or they feel unsafe in any way.   Maria JAYSON Rase, MD Telepsychiatry Consult Services    [1] No Known Allergies [2]  Social History Tobacco Use  Smoking Status Some Days   Types: Cigarettes  Smokeless Tobacco Never

## 2024-08-16 NOTE — ED Notes (Signed)
 Dinner tray provided to pt

## 2024-08-16 NOTE — ED Notes (Signed)
Pt up to restroom with steady gait. No distress noted at this time.    

## 2024-08-16 NOTE — ED Notes (Signed)
 VOL/  PENDING  CONSULT

## 2024-08-16 NOTE — ED Notes (Signed)
 IRIS monitor set up in pt room. Pt informed of pending evaluation. Pt Verbalized understanding.

## 2024-08-16 NOTE — ED Notes (Signed)
 Evaluation complete. Pt appears calm and cooperative.

## 2024-08-16 NOTE — ED Provider Notes (Addendum)
 "  [facility] Provider Note    Event Date/Time   First MD Initiated Contact with Patient 08/16/24 1520     (approximate)   History   Depression   HPI  Maria Dickerson is a 44 y.o. female patient presents to the emergency department for stress and depression.  Patient states that she has been compliant with all of her home medications which consist of olanzapine  and Depakote .  Patient states that starting last night she just feels like something is stuck inside of her.'s feels like she just needs to get out and that there is a kit inside of her or just something is off and not right.  Denies any drug use or alcohol use.  States that she lives with her family and has a large support system.  States that she follows as an outpatient with psychiatry and therapist.  No active SI.  Patient endorsed to triage nurse that she had thoughts of taking pills to overdose but does not feel that she would do this because she does not want to go to hell.     Physical Exam   Triage Vital Signs: ED Triage Vitals  Encounter Vitals Group     BP 08/16/24 1442 (!) 158/96     Girls Systolic BP Percentile --      Girls Diastolic BP Percentile --      Boys Systolic BP Percentile --      Boys Diastolic BP Percentile --      Pulse Rate 08/16/24 1442 (!) 106     Resp 08/16/24 1442 18     Temp 08/16/24 1442 98.3 F (36.8 C)     Temp Source 08/16/24 1442 Oral     SpO2 08/16/24 1442 95 %     Weight 08/16/24 1443 154 lb 5.2 oz (70 kg)     Height 08/16/24 1443 5' 2 (1.575 m)     Head Circumference --      Peak Flow --      Pain Score 08/16/24 1443 0     Pain Loc --      Pain Education --      Exclude from Growth Chart --     Most recent vital signs: Vitals:   08/16/24 1442  BP: (!) 158/96  Pulse: (!) 106  Resp: 18  Temp: 98.3 F (36.8 C)  SpO2: 95%    Physical Exam Constitutional:      Appearance: She is well-developed.  HENT:     Head: Atraumatic.  Eyes:      Conjunctiva/sclera: Conjunctivae normal.  Cardiovascular:     Rate and Rhythm: Regular rhythm.  Pulmonary:     Effort: No respiratory distress.  Abdominal:     General: There is no distension.  Musculoskeletal:        General: Normal range of motion.     Cervical back: Normal range of motion.  Skin:    General: Skin is warm.  Neurological:     Mental Status: She is alert. Mental status is at baseline.  Psychiatric:        Mood and Affect: Mood is anxious.        Speech: Speech normal.        Behavior: Behavior is cooperative.        Thought Content: Thought content includes suicidal ideation. Thought content does not include homicidal ideation. Thought content does not include homicidal or suicidal plan.     IMPRESSION / MDM / ASSESSMENT AND PLAN /  ED COURSE  I reviewed the triage vital signs and the nursing notes.  Differential diagnosis including depression, medication noncompliance, suicidal ideation electrolyte abnormality, intoxication  EKG  I, Clotilda Punter, the attending physician, personally viewed and interpreted this ECG.  EKG showed normal sinus rhythm.  Normal intervals.  No significant chamber enlargement.  Isolated T wave that is inverted to lead III.  No findings of acute ischemia or dysrhythmia.  LABS (all labs ordered are listed, but only abnormal results are displayed) Labs interpreted as -    Labs Reviewed  COMPREHENSIVE METABOLIC PANEL WITH GFR - Abnormal; Notable for the following components:      Result Value   CO2 20 (*)    Glucose, Bld 111 (*)    BUN <5 (*)    All other components within normal limits  VALPROIC ACID  LEVEL - Abnormal; Notable for the following components:   Valproic Acid  Lvl 40 (*)    All other components within normal limits  ETHANOL  CBC  URINE DRUG SCREEN  POC URINE PREG, ED    MDM  Patient presents to the emergency department with passive suicidal ideation and feeling like there is a little girl stuck inside of her.   No significant electrolyte abnormality.  Feel that the patient is medically cleared.  Plan for consultation for psychiatry for further evaluation.  Clinical Course as of 08/16/24 2036  Tue Aug 16, 2024  2033 Patient was evaluated by psychiatry, recommended discharge home with outpatient follow-up. [SM]    Clinical Course User Index [SM] Punter Clotilda, MD     The patient has been placed in psychiatric observation due to the need to provide a safe environment for the patient while obtaining psychiatric consultation and evaluation, as well as ongoing medical and medication management to treat the patient's condition.  The patient has not been placed under full IVC at this time.  Patient discharged home in stable condition.  I discussed with psychiatry whether they would want to make any changes of her home medications given her low Depakote  level.  States that they did not recommend any further medication changes at that time and that she can follow-up with her psychiatrist tomorrow for any further medication changes.   PROCEDURES:  Critical Care performed: No  Procedures  Patient's presentation is most consistent with acute presentation with potential threat to life or bodily function.   MEDICATIONS ORDERED IN ED: Medications - No data to display  FINAL CLINICAL IMPRESSION(S) / ED DIAGNOSES   Final diagnoses:  Depression, unspecified depression type  Anxiety  Suicidal ideation     Rx / DC Orders   ED Discharge Orders     None        Note:  This document was prepared using Dragon voice recognition software and may include unintentional dictation errors.   Punter Clotilda, MD 08/16/24 1615    Punter Clotilda, MD 08/16/24 2036  "

## 2024-08-16 NOTE — BH Assessment (Signed)
 Psych consult was placed to [facility] so patient can be seen.

## 2024-08-16 NOTE — ED Triage Notes (Signed)
 Pt presents to the ED via POV from home for depression. Pt reports that she has been depressed for a really long time. Pt reports that she has thoughts that she would rather be dead. Reports feeling like there is a little girl inside her. Pt states that she has questioned whether or not to take pills and overdose, but states that she doesn't want to go to hell for doing this. Pt denies intentions of acting these thoughts. Pt states that she has an appointment with PCP tomorrow, but was concerned so came in today.  Pt is currently taking depakote  and zyprexa . States that she was not supposed to be taking them, but states that they have helped so much. Pt has been taking these two for the past week.  Pt dressed out and items inventoried by this RN and Hammond, NT. Belongings include:  Purse Tan jacket Gray boots Keyspan Pink bra Jeans  All items placed in a singular patient belongings bag.

## 2024-08-20 NOTE — ED Notes (Signed)
 This RN called pt at 08/20/24 @ 759pm. I found her bag of pills at [facility] desk. She will come and get them.

## 2024-08-23 ENCOUNTER — Emergency Department
Admission: EM | Admit: 2024-08-23 | Discharge: 2024-08-23 | Disposition: A | Discharge status: Home / Self Care | Attending: Emergency Medicine | Admitting: Emergency Medicine

## 2024-08-23 ENCOUNTER — Other Ambulatory Visit: Payer: Self-pay

## 2024-08-23 DIAGNOSIS — F3181 Bipolar II disorder: Secondary | ICD-10-CM

## 2024-08-23 DIAGNOSIS — F603 Borderline personality disorder: Secondary | ICD-10-CM | POA: Diagnosis not present

## 2024-08-23 DIAGNOSIS — Z79899 Other long term (current) drug therapy: Secondary | ICD-10-CM | POA: Diagnosis not present

## 2024-08-23 DIAGNOSIS — R45851 Suicidal ideations: Secondary | ICD-10-CM | POA: Diagnosis present

## 2024-08-23 DIAGNOSIS — F32A Depression, unspecified: Secondary | ICD-10-CM | POA: Diagnosis present

## 2024-08-23 LAB — COMPREHENSIVE METABOLIC PANEL WITH GFR
ALT: 7 U/L (ref 0–44)
AST: 12 U/L — ABNORMAL LOW (ref 15–41)
Albumin: 4.6 g/dL (ref 3.5–5.0)
Alkaline Phosphatase: 49 U/L (ref 38–126)
Anion gap: 11 (ref 5–15)
BUN: 6 mg/dL (ref 6–20)
CO2: 20 mmol/L — ABNORMAL LOW (ref 22–32)
Calcium: 8.9 mg/dL (ref 8.9–10.3)
Chloride: 108 mmol/L (ref 98–111)
Creatinine, Ser: 0.63 mg/dL (ref 0.44–1.00)
GFR, Estimated: >60 mL/min
Glucose, Bld: 92 mg/dL (ref 70–99)
Potassium: 4.6 mmol/L (ref 3.5–5.1)
Sodium: 139 mmol/L (ref 135–145)
Total Bilirubin: <0.2 mg/dL (ref 0.0–1.2)
Total Protein: 7.2 g/dL (ref 6.5–8.1)

## 2024-08-23 LAB — CBC
HCT: 39.5 % (ref 36.0–46.0)
Hemoglobin: 13.4 g/dL (ref 12.0–15.0)
MCH: 30 pg (ref 26.0–34.0)
MCHC: 33.9 g/dL (ref 30.0–36.0)
MCV: 88.4 fL (ref 80.0–100.0)
Platelets: 265 K/uL (ref 150–400)
RBC: 4.47 MIL/uL (ref 3.87–5.11)
RDW: 11.9 % (ref 11.5–15.5)
WBC: 7.5 K/uL (ref 4.0–10.5)
nRBC: 0 % (ref 0.0–0.2)

## 2024-08-23 LAB — ETHANOL: Alcohol, Ethyl (B): <15 mg/dL

## 2024-08-23 LAB — URINE DRUG SCREEN
Amphetamines: NEGATIVE
Barbiturates: NEGATIVE
Benzodiazepines: NEGATIVE
Cocaine: NEGATIVE
Fentanyl: NEGATIVE
Methadone Scn, Ur: NEGATIVE
Opiates: NEGATIVE
Tetrahydrocannabinol: NEGATIVE

## 2024-08-23 LAB — POC URINE PREG, ED: Preg Test, Ur: NEGATIVE

## 2024-08-23 NOTE — Discharge Instructions (Signed)
 Please follow-up with as instructed by psychiatry.  Continue your medications.  Return to the emergency department for any new or worsening symptoms.

## 2024-08-23 NOTE — ED Triage Notes (Signed)
 Pt to ED for depression. States has no will to live. Endorses SI with plan to drink bathroom cleaning solution. Reports recent change to medications. Seen recently for same.

## 2024-08-23 NOTE — Consult Note (Signed)
 [facility] Consult Note  Patient Name: Maria Dickerson MRN: [id number] DOB: 1981/07/27 DATE OF Consult: 08/23/2024 Consult Order details:  Orders (From admission, onward)     Start     Ordered   08/23/24 1317  CONSULT TO CALL ACT TEAM       Ordering Provider: Dorothyann Drivers, MD  Provider:  (Not yet assigned)  Question:  Reason for Consult?  Answer:  Psych consult   08/23/24 1317   08/23/24 1317  IP CONSULT TO PSYCHIATRY       Ordering Provider: Dorothyann Drivers, MD  Provider:  (Not yet assigned)  Question Answer Comment  Consult Timeframe ROUTINE - requires response within 24 hours   Reason for Consult? Consult for medication management   Contact phone number where the requesting provider can be reached [phone]      08/23/24 1317            PRIMARY PSYCHIATRIC DIAGNOSES  1.  Borderline personality disorder 2.  Bipolar 2 disorder  RECOMMENDATIONS  Recommendations: Medication recommendations: Continue home medications Non-Medication/therapeutic recommendations: Supportive care There are no psychiatric contraindications to discharge at this time Plan Post Discharge/Psychiatric Care Follow-up resources Follow-up with outpatient team Follow-Up Telepsychiatry C/L services: We will sign off for now. Please re-consult our service if needed for any concerning changes in the patient's condition, discharge planning, or questions. Communication: Treatment team members (and family members if applicable) who were involved in treatment/care discussions and planning, and with whom we spoke or engaged with via secure text/chat, include the following: G. Fumich, RN  I personally spent a total of 35 minutes in the care of the patient today including preparing to see the patient, counseling and educating, referring and communicating with other health care professionals, documenting clinical information in the EHR, and coordinating care.  Thank you for involving us  in the care of  this patient. If you have any additional questions or concerns, please call [phone] and ask for me or the provider on-call.  TELEPSYCHIATRY ATTESTATION & CONSENT  As the provider for this telehealth consult, I attest that I verified the patients identity using two separate identifiers, introduced myself to the patient, provided my credentials, disclosed my location, and performed this encounter via a HIPAA-compliant, real-time, face-to-face, two-way, interactive audio and video platform and with the full consent and agreement of the patient (or guardian as applicable.)  Patient physical location: [place]. Telehealth provider physical location: home office in state of [place].  Video start time: 1535 (Central Time) Video end time: 1550 (Central Time)  IDENTIFYING DATA  Maria Dickerson is a 44 y.o. year-old female for whom a psychiatric consultation has been ordered by the primary provider. The patient was identified using two separate identifiers.  CHIEF COMPLAINT/REASON FOR CONSULT   Feeling upset after a fight with her mother  HISTORY OF PRESENT ILLNESS (HPI)  Psych consult requested for evaluation of 44 year old female with history of borderline personality disorder, bipolar 2 disorder presented to the ED today with complaint of depression, insomnia, vague passive suicidal thoughts since her medications were changed 2 weeks ago.  During evaluation, patient is alert oriented calm cooperative and pleasant, affect is reactive, laughing and smiling.  Reports she came to the hospital because she had a fight with her mother and was upset.  Reports currently feeling better after eating twice, ate broccoli which she likes very much.  Denies any psychiatric complaints.  Denies feeling depressed or suicidal.  Denies perceptual disturbances no delusion elicited.  Denies symptoms suggestive of mania  anxiety or psychosis.  Not interested in medication changes.  Reports she is scheduled to see her  psychiatrist tomorrow  PAST PSYCHIATRIC HISTORY   Otherwise as per HPI above.  PAST MEDICAL HISTORY  Past Medical History:  Diagnosis Date   Depression    Skin cancer      HOME MEDICATIONS  PTA Medications  Medication Sig   citalopram  (CELEXA ) 20 MG tablet Take 20 mg by mouth every morning. (Patient not taking: Reported on 03/28/2024)   Oxcarbazepine  (TRILEPTAL ) 300 MG tablet Take 300 mg by mouth 2 (two) times daily. (Patient not taking: Reported on 03/28/2024)   divalproex  (DEPAKOTE ) 500 MG DR tablet Take 1 tablet (500mg ) by mouth twice daily.   OLANZapine  (ZYPREXA ) 20 MG tablet Take 1 tablet (20mg ) by mouth once daily at bedtime.   divalproex  (DEPAKOTE ) 500 MG DR tablet Take 500 mg by mouth 3 (three) times daily.   OLANZapine  (ZYPREXA ) 5 MG tablet Take 5 mg by mouth daily.    ALLERGIES  Allergies[1]  SOCIAL & SUBSTANCE USE HISTORY  Social History   Socioeconomic History   Marital status: Single    Spouse name: Not on file   Number of children: Not on file   Years of education: Not on file   Highest education level: Not on file  Occupational History   Not on file  Tobacco Use   Smoking status: Some Days    Types: Cigarettes   Smokeless tobacco: Never  Vaping Use   Vaping status: Never Used  Substance and Sexual Activity   Alcohol use: No   Drug use: No   Sexual activity: Not on file  Other Topics Concern   Not on file  Social History Narrative   Not on file   Social Drivers of Health   Tobacco Use: High Risk (08/23/2024)   Patient History    Smoking Tobacco Use: Some Days    Smokeless Tobacco Use: Never    Passive Exposure: Not on file  Financial Resource Strain: Low Risk  (03/29/2024)   Received from [facility]   Overall Financial Resource Strain (CARDIA)    Difficulty of Paying Living Expenses: Not hard at all  Food Insecurity: No Food Insecurity (03/29/2024)   Received from [facility]   Epic    Within the past 12  months, you worried that your food would run out before you got the money to buy more.: Never true    Within the past 12 months, the food you bought just didn't last and you didn't have money to get more.: Never true  Transportation Needs: No Transportation Needs (03/29/2024)   Received from [facility] - Transportation    In the past 12 months, has lack of transportation kept you from medical appointments or from getting medications?: No    Lack of Transportation (Non-Medical): No  Physical Activity: Not on file  Stress: Not on file  Social Connections: Not on file  Depression (EYV7-0): Not on file  Alcohol Screen: Not on file  Housing: Low Risk  (03/29/2024)   Received from [facility]   Epic    In the last 12 months, was there a time when you were not able to pay the mortgage or rent on time?: No    In the past 12 months, how many times have you moved where you were living?: 0    At any time in the past 12 months, were you homeless  or living in a shelter (including now)?: No  Utilities: Not At Risk (03/29/2024)   Received from [facility]   Epic    In the past 12 months has the electric, gas, oil, or water company threatened to shut off services in your home?: No  Health Literacy: Not on file   Tobacco Use History[2] Social History   Substance and Sexual Activity  Alcohol Use No   Social History   Substance and Sexual Activity  Drug Use No    Additional pertinent information .  FAMILY HISTORY  History reviewed. No pertinent family history. Family Psychiatric History (if known):    MENTAL STATUS EXAM (MSE)  Mental Status Exam: General Appearance: Fairly Groomed  Orientation:  Full (Time, Place, and Person)  Memory:  Immediate;   Fair Recent;   Fair Remote;   Fair  Concentration:  Concentration: Fair and Attention Span: Fair  Recall:  Fair  Attention  Fair  Eye Contact:  Fair  Speech:  Clear and Coherent  and Normal Rate  Language:  Fair  Volume:  Normal  Mood: good  Affect:  Appropriate  Thought Process:  Coherent  Thought Content:  Negative  Suicidal Thoughts:  No  Homicidal Thoughts:  No  Judgement:  Fair  Insight:  Fair  Psychomotor Activity:  Normal  Akathisia:  No  Fund of Knowledge:  Fair    Assets:  Physical Health Social Support  Cognition:  WNL  ADL's:  Intact  AIMS (if indicated):       VITALS  Blood pressure (!) 131/101, pulse 99, temperature 98.5 F (36.9 C), resp. rate 18, height 5' 2 (1.575 m), weight 70 kg, last menstrual period 08/23/2024, SpO2 98%.  LABS  Admission on 08/23/2024  Component Date Value Ref Range Status   Sodium 08/23/2024 139  135 - 145 mmol/L Final   Potassium 08/23/2024 4.6  3.5 - 5.1 mmol/L Final   Chloride 08/23/2024 108  98 - 111 mmol/L Final   CO2 08/23/2024 20 (L)  22 - 32 mmol/L Final   Glucose, Bld 08/23/2024 92  70 - 99 mg/dL Final   Glucose reference range applies only to samples taken after fasting for at least 8 hours.   BUN 08/23/2024 6  6 - 20 mg/dL Final   Creatinine, Ser 08/23/2024 0.63  0.44 - 1.00 mg/dL Final   Calcium 98/79/7973 8.9  8.9 - 10.3 mg/dL Final   Total Protein 98/79/7973 7.2  6.5 - 8.1 g/dL Final   Albumin 98/79/7973 4.6  3.5 - 5.0 g/dL Final   AST 98/79/7973 12 (L)  15 - 41 U/L Final   ALT 08/23/2024 7  0 - 44 U/L Final   Alkaline Phosphatase 08/23/2024 49  38 - 126 U/L Final   Total Bilirubin 08/23/2024 <0.2  0.0 - 1.2 mg/dL Final   GFR, Estimated 08/23/2024 >60  >60 mL/min Final   Comment: (NOTE) Calculated using the CKD-EPI Creatinine Equation (2021)    Anion gap 08/23/2024 11  5 - 15 Final   Performed at [facility], [address] [place], [place] [address]   Alcohol, Ethyl (B) 08/23/2024 <15  <15 mg/dL Final   Comment: (NOTE) For medical purposes only. Performed at [facility], [address] [place], [place] [address]    WBC 08/23/2024 7.5  4.0 - 10.5 K/uL Final    RBC 08/23/2024 4.47  3.87 - 5.11 MIL/uL Final   Hemoglobin 08/23/2024 13.4  12.0 - 15.0 g/dL Final  HCT 08/23/2024 39.5  36.0 - 46.0 % Final   MCV 08/23/2024 88.4  80.0 - 100.0 fL Final   MCH 08/23/2024 30.0  26.0 - 34.0 pg Final   MCHC 08/23/2024 33.9  30.0 - 36.0 g/dL Final   RDW 98/79/7973 11.9  11.5 - 15.5 % Final   Platelets 08/23/2024 265  150 - 400 K/uL Final   nRBC 08/23/2024 0.0  0.0 - 0.2 % Final   Performed at [facility], [address] [place], [place] [address]   Opiates 08/23/2024 NEGATIVE  NEGATIVE Final   Cocaine 08/23/2024 NEGATIVE  NEGATIVE Final   Benzodiazepines 08/23/2024 NEGATIVE  NEGATIVE Final   Amphetamines 08/23/2024 NEGATIVE  NEGATIVE Final   Tetrahydrocannabinol 08/23/2024 NEGATIVE  NEGATIVE Final   Barbiturates 08/23/2024 NEGATIVE  NEGATIVE Final   Methadone Scn, Ur 08/23/2024 NEGATIVE  NEGATIVE Final   Fentanyl 08/23/2024 NEGATIVE  NEGATIVE Final   Comment: (NOTE) Drug screen is for Medical Purposes only. Positive results are preliminary only. If confirmation is needed, notify lab within 5 days.  Drug Class                 Cutoff (ng/mL) Amphetamine and metabolites 1000 Barbiturate and metabolites 200 Benzodiazepine              200 Opiates and metabolites     300 Cocaine and metabolites     300 THC                         50 Fentanyl                    5 Methadone                   300  Trazodone  is metabolized in vivo to several metabolites,  including pharmacologically active m-CPP, which is excreted in the  urine.  Immunoassay screens for amphetamines and MDMA have potential  cross-reactivity with these compounds and may provide false positive  result.  Performed at [facility], [address] [place], [place] [address]    Preg Test, Ur 08/23/2024 NEGATIVE  NEGATIVE Final   Comment:        THE SENSITIVITY OF THIS METHODOLOGY IS >20 mIU/mL.     PSYCHIATRIC REVIEW OF SYSTEMS (ROS)  ROS: Notable for the  following relevant positive findings: ROS  Additional findings:      Musculoskeletal: No abnormal movements observed      Gait & Station: Normal      Pain Screening: Denies      Nutrition & Dental Concerns: none  RISK FORMULATION/ASSESSMENT  Is the patient experiencing any suicidal or homicidal ideations: No      Protective factors considered for safety management: Engaging in treatment, has supports  Risk factors/concerns considered for safety management:  Depression Impulsivity Unmarried  Is there a astronomer plan with the patient and treatment team to minimize risk factors and promote protective factors: Yes           Explain: Outpatient management Is crisis care placement or psychiatric hospitalization recommended: No     Based on my current evaluation and risk assessment, patient is determined at this time to be at:  Low risk  *RISK ASSESSMENT Risk assessment is a dynamic process; it is possible that this patient's condition, and risk level, may change. This should be re-evaluated and managed over time as appropriate. Please re-consult psychiatric consult services if additional assistance is  needed in terms of risk assessment and management. If your team decides to discharge this patient, please advise the patient how to best access emergency psychiatric services, or to call 911, if their condition worsens or they feel unsafe in any way.   Maria Dickerson Maria Serria Sloma, MD Telepsychiatry Consult Services     [1] No Known Allergies [2]  Social History Tobacco Use  Smoking Status Some Days   Types: Cigarettes  Smokeless Tobacco Never

## 2024-08-23 NOTE — ED Notes (Signed)
 Lunch tray provided to pt.

## 2024-08-23 NOTE — ED Notes (Signed)
 Dinner tray provided to pt

## 2024-08-23 NOTE — ED Provider Notes (Signed)
" ° °  [facility] Provider Note    Event Date/Time   First MD Initiated Contact with Patient 08/23/24 1250     (approximate)  History   Chief Complaint: Depression  HPI  Maria Dickerson is a 44 y.o. female with a past medical history of depression who presents to the emergency department for worsening depression.  According to the patient she states approximately 3 weeks ago her medications were changed.  She says since her medications have changed she has been experiencing trouble sleeping has been experiencing worsening depression symptoms.  Nurse reported vague suicidal statements patient denies any active suicidal statements to myself.  States she just felt like she needed help and could not go on like she is currently living.  However patient is happy to be here voluntarily for treatment.  Physical Exam   Triage Vital Signs: ED Triage Vitals [08/23/24 1213]  Encounter Vitals Group     BP (!) 131/101     Girls Systolic BP Percentile      Girls Diastolic BP Percentile      Boys Systolic BP Percentile      Boys Diastolic BP Percentile      Pulse Rate 99     Resp 18     Temp 98.5 F (36.9 C)     Temp src      SpO2 98 %     Weight 154 lb 5.2 oz (70 kg)     Height 5' 2 (1.575 m)     Head Circumference      Peak Flow      Pain Score 0     Pain Loc      Pain Education      Exclude from Growth Chart     Most recent vital signs: Vitals:   08/23/24 1213  BP: (!) 131/101  Pulse: 99  Resp: 18  Temp: 98.5 F (36.9 C)  SpO2: 98%    General: Awake, no distress.  CV:  Good peripheral perfusion.  Regular rate and rhythm  Resp:  Normal effort.  Equal breath sounds bilaterally.  Abd:  No distention.  Soft, nontender.  No rebound or guarding.  ED Results / Procedures / Treatments   MEDICATIONS ORDERED IN ED: Medications - No data to display   IMPRESSION / MDM / ASSESSMENT AND PLAN / ED COURSE  I reviewed the triage vital signs and the nursing  notes.  Patient's presentation is most consistent with acute presentation with potential threat to life or bodily function.  Patient presents to the emergency department for worsening depression.  No active SI.  We will keep the patient voluntary at this time.  Patient CBC is normal, chemistry is normal pregnancy test is negative.  Will have psychiatry and TTS evaluate.  Will continue to closely monitor.  FINAL CLINICAL IMPRESSION(S) / ED DIAGNOSES   Depression    Note:  This document was prepared using Dragon voice recognition software and may include unintentional dictation errors.   Dorothyann Drivers, MD 08/23/24 1316  "

## 2024-08-23 NOTE — ED Notes (Addendum)
 Belongings: Pink shoes [employer] st[employer] Gray pants Black socks Black jacket [employer] purse White bra Pink underwear

## 2024-08-23 NOTE — BH Assessment (Signed)
 Comprehensive Clinical Assessment (CCA) Screening, Triage and Referral Note  08/23/2024 Maria Dickerson [id number]  Maria Dickerson, 44 year old female who presents to [facility] ED voluntarily for treatment. Per triage note, Pt to ED for depression. States has no will to live. Endorses SI with plan to drink bathroom cleaning solution. Reports recent changes to medications. Seen recently for same.   During TTS assessment, pt presents alert and oriented x 4, restless but cooperative, and mood-congruent with affect. The pt does not appear to be responding to internal or external stimuli. Neither is the pt presenting with any delusional thinking. Pt verified the information provided to triage RN.   Pt identifies her main complaint to be My eyes are always open. Patient reports she is not eating or sleeping since the change in her medications. Patient reports she is being followed by Dr. Rumaldo at [facility]. Patient states she has reached out to the provider regarding her symptoms; however, she was advised that she will be seen next month. Patient reports she would like her medications to be adjusted so that she is able to get some rest. Patient reports sleep deprivation presents more of a problem for her at this time and is not open to inpatient treatment. I am not sure if I would be able to handle that again. Patient denies using any illicit substances or alcohol. Pt denies current SI/HI/AH/VH. Pt contracts for safety.    Per Dr. Barth), pt does not meet criteria for inpatient psychiatric admission and can be discharged.   Chief Complaint:  Chief Complaint  Patient presents with   Depression   Visit Diagnosis: Borderline personality disorder  Patient Reported Information How did you hear about us ? Self  What Is the Reason for Your Visit/Call Today? Medication adjustment  How Long Has This Been Causing You Problems? 1 wk - 1 month  What Do You Feel Would Help You the Most Today?  Medication(s)   Have You Recently Had Any Thoughts About Hurting Yourself? No  Are You Planning to Commit Suicide/Harm Yourself At This time? No   Have you Recently Had Thoughts About Hurting Someone [place]? No  Are You Planning to Harm Someone at This Time? No  Explanation: No data recorded  Have You Used Any Alcohol or Drugs in the Past 24 Hours? No  How Long Ago Did You Use Drugs or Alcohol? No data recorded What Did You Use and How Much? No data recorded  Do You Currently Have a Therapist/Psychiatrist? Yes  Name of Therapist/Psychiatrist: [facility]   Have You Been Recently Discharged From Any Office Practice or Programs? No  Explanation of Discharge From Practice/Program: No data recorded   CCA Screening Triage Referral Assessment Type of Contact: Face-to-Face  Telemedicine Service Delivery:   Is this Initial or Reassessment?   Date Telepsych consult ordered in [facility]:    Time Telepsych consult ordered in [facility]:    Location of Assessment: [facility] ED  Provider Location: [facility] ED    Collateral Involvement: None provided   Does Patient Have a Court Appointed Legal Guardian? No data recorded Name and Contact of Legal Guardian: No data recorded If Minor and Not Living with Parent(s), Who has Custody? No data recorded Is CPS involved or ever been involved? No data recorded Is APS involved or ever been involved? No data recorded  Patient Determined To Be At Risk for Harm To Self or Others Based on Review of Patient Reported Information or Presenting Complaint? No  Method: No Plan  Availability of  Means: No access or NA  Intent: Vague intent or NA  Notification Required: No need or identified person  Additional Information for Danger to Others Potential: No data recorded Additional Comments for Danger to Others Potential: No data recorded Are There Guns or Other Weapons in Your Home? No data recorded Types of Guns/Weapons: No data recorded Are These Weapons Safely Secured?                             No data recorded Who Could Verify You Are Able To Have These Secured: No data recorded Do You Have any Outstanding Charges, Pending Court Dates, Parole/Probation? No data recorded Contacted To Inform of Risk of Harm To Self or Others: No data recorded  Does Patient Present under Involuntary Commitment? No    [place] of Residence: [place]   Patient Currently Receiving the Following Services: Medication Management   Determination of Need: Emergent (2 hours)   Options For Referral: ED Visit; Medication Management   Disposition Recommendation per psychiatric provider: Per Dr. Kodjo([facility]), pt does not meet criteria for inpatient psychiatric admission and can be discharged.  Maria Dickerson, Counselor, LCAS-A

## 2024-08-23 NOTE — BH Assessment (Signed)
 Writer requested psych consult via [facility] providers.

## 2024-08-23 NOTE — ED Notes (Signed)
 Pt safe for discharge at this time. Discharge instructions reviewed and pt verbalized understanding. Pt denies any questions. Pt ambulatory out of department with steady gait dressed in appropriate clothing.

## 2024-08-23 NOTE — ED Provider Notes (Signed)
" °  Physical Exam  BP 128/87   Pulse 81   Temp 98.5 F (36.9 C)   Resp 16   Ht 5' 2 (1.575 m)   Wt 70 kg   LMP 08/23/2024 (Approximate)   SpO2 99%   BMI 28.23 kg/m   Physical Exam  Procedures  Procedures  ED Course / MDM    Medical Decision Making Amount and/or Complexity of Data Reviewed Labs: ordered.   Patient was cleared by psychiatry for discharge.  They recommend outpatient follow-up.  They do not recommend any changes in her medications.  Patient will be discharged with instructions to follow-up as instructed by psychiatry.  She will return immediately to the emergency department for new or worsening symptoms.       Rexford Reche HERO, MD 08/23/24 2321  "
